# Patient Record
Sex: Male | Born: 1945
Health system: Southern US, Community
[De-identification: ages and names within clinical notes are randomized; demographics above are authoritative.]

## PROBLEM LIST (undated history)

## (undated) ENCOUNTER — Emergency Department: Disposition: A | Discharge status: Other Acute Inpatient Hospital | Payer: Medicare HMO

## (undated) DIAGNOSIS — I10 Essential (primary) hypertension: Secondary | ICD-10-CM

## (undated) DIAGNOSIS — E785 Hyperlipidemia, unspecified: Secondary | ICD-10-CM

## (undated) DIAGNOSIS — E119 Type 2 diabetes mellitus without complications: Secondary | ICD-10-CM

## (undated) DIAGNOSIS — M1990 Unspecified osteoarthritis, unspecified site: Secondary | ICD-10-CM

## (undated) HISTORY — DX: Hyperlipidemia, unspecified: E78.5

## (undated) HISTORY — DX: Essential (primary) hypertension: I10

## (undated) HISTORY — DX: Type 2 diabetes mellitus without complications: E11.9

## (undated) HISTORY — PX: HERNIA REPAIR: SHX51

---

## 1998-01-24 HISTORY — PX: HERNIA REPAIR: SHX51

## 2000-01-25 HISTORY — PX: COLONOSCOPY: SHX174

## 2008-04-29 ENCOUNTER — Ambulatory Visit: Payer: Self-pay | Admitting: Internal Medicine

## 2011-07-07 ENCOUNTER — Ambulatory Visit: Payer: Self-pay | Admitting: Internal Medicine

## 2012-10-19 ENCOUNTER — Encounter: Payer: Self-pay | Admitting: *Deleted

## 2012-11-13 ENCOUNTER — Encounter: Payer: Self-pay | Admitting: General Surgery

## 2012-11-13 ENCOUNTER — Ambulatory Visit (INDEPENDENT_AMBULATORY_CARE_PROVIDER_SITE_OTHER): Payer: Medicare Other | Admitting: General Surgery

## 2012-11-13 VITALS — BP 142/82 | HR 88 | Resp 16 | Ht 66.0 in | Wt 206.0 lb

## 2012-11-13 DIAGNOSIS — Z1211 Encounter for screening for malignant neoplasm of colon: Secondary | ICD-10-CM

## 2012-11-13 MED ORDER — POLYETHYLENE GLYCOL 3350 17 GM/SCOOP PO POWD: ORAL | Status: DC

## 2012-11-13 NOTE — Patient Instructions (Addendum)
Colonoscopy A colonoscopy is an exam to evaluate your entire colon. In this exam, your colon is cleansed. A long fiberoptic tube is inserted through your rectum and into your colon. The fiberoptic scope (endoscope) is a long bundle of enclosed and very flexible fibers. These fibers transmit light to the area examined and send images from that area to your caregiver. Discomfort is usually minimal. You may be given a drug to help you sleep (sedative) during or prior to the procedure. This exam helps to detect lumps (tumors), polyps, inflammation, and areas of bleeding. Your caregiver may also take a small piece of tissue (biopsy) that will be examined under a microscope. LET YOUR CAREGIVER KNOW ABOUT:   Allergies to food or medicine.  Medicines taken, including vitamins, herbs, eyedrops, over-the-counter medicines, and creams.  Use of steroids (by mouth or creams).  Previous problems with anesthetics or numbing medicines.  History of bleeding problems or blood clots.  Previous surgery.  Other health problems, including diabetes and kidney problems.  Possibility of pregnancy, if this applies. BEFORE THE PROCEDURE   A clear liquid diet may be required for 2 days before the exam.  Ask your caregiver about changing or stopping your regular medications.  Liquid injections (enemas) or laxatives may be required.  A large amount of electrolyte solution may be given to you to drink over a short period of time. This solution is used to clean out your colon.  You should be present 60 minutes prior to your procedure or as directed by your caregiver. AFTER THE PROCEDURE   If you received a sedative or pain relieving medication, you will need to arrange for someone to drive you home.  Occasionally, there is a little blood passed with the first bowel movement. Do not be concerned. FINDING OUT THE RESULTS OF YOUR TEST Not all test results are available during your visit. If your test results are  not back during the visit, make an appointment with your caregiver to find out the results. Do not assume everything is normal if you have not heard from your caregiver or the medical facility. It is important for you to follow up on all of your test results. HOME CARE INSTRUCTIONS   It is not unusual to pass moderate amounts of gas and experience mild abdominal cramping following the procedure. This is due to air being used to inflate your colon during the exam. Walking or a warm pack on your belly (abdomen) may help.  You may resume all normal meals and activities after sedatives and medicines have worn off.  Only take over-the-counter or prescription medicines for pain, discomfort, or fever as directed by your caregiver. Do not use aspirin or blood thinners if a biopsy was taken. Consult your caregiver for medicine usage if biopsies were taken. SEEK IMMEDIATE MEDICAL CARE IF:   You have a fever.  You pass large blood clots or fill a toilet with blood following the procedure. This may also occur 10 to 14 days following the procedure. This is more likely if a biopsy was taken.  You develop abdominal pain that keeps getting worse and cannot be relieved with medicine. Document Released: 01/08/2000 Document Revised: 04/04/2011 Document Reviewed: 08/23/2007 [facility] Patient Information 2014 [place], [place].  Patient has been scheduled for a colonoscopy on 11-20-12 at [facility].

## 2012-11-13 NOTE — Progress Notes (Signed)
Patient ID: Albert Gray, male   DOB: [birth year], 66 y.o.   MRN: [id number]  Chief Complaint  Patient presents with  . Other    screening colonoscopy    HPI Albert Gray is a 67 y.o. male.  Here today to discuss having a screening colonoscopy referred by Dr. Juel Burrow. Denies and gastrointestinal issues at this time.  HPI  Past Medical History  Diagnosis Date  . Hypertension     Past Surgical History  Procedure Laterality Date  . Colonoscopy  2002    Dr Evette Cristal  . Hernia repair      History reviewed. No pertinent family history.  Social History History  Substance Use Topics  . Smoking status: Never Smoker   . Smokeless tobacco: Not on file  . Alcohol Use: No    No Known Allergies  Current Outpatient Prescriptions  Medication Sig Dispense Refill  . acetaminophen (TYLENOL) 325 MG tablet Take 650 mg by mouth daily.      . finasteride (PROSCAR) 5 MG tablet Take 5 mg by mouth daily.      . polyethylene glycol powder (GLYCOLAX/MIRALAX) powder 255 grams one bottle for colonoscopy prep  255 g  0   No current facility-administered medications for this visit.    Review of Systems Review of Systems  Constitutional: Negative.   Respiratory: Negative.   Cardiovascular: Negative.   Gastrointestinal: Negative.     Blood pressure 142/82, pulse 88, resp. rate 16, height 5\' 6"  (1.676 m), weight 206 lb (93.441 kg).  Physical Exam Physical Exam  Constitutional: He is oriented to person, place, and time. He appears well-developed and well-nourished.  Eyes: Conjunctivae are normal. No scleral icterus.  Neck: Neck supple.  Cardiovascular: Normal rate, regular rhythm and normal heart sounds.   Pulmonary/Chest: Effort normal and breath sounds normal.  Abdominal: Soft. Normal appearance and bowel sounds are normal. There is no hepatosplenomegaly. No hernia.  Lymphadenopathy:    He has no cervical adenopathy.  Neurological: He is alert and oriented to person, place, and  time.  Skin: Skin is warm and dry.    Data Reviewed Dr. Fredna Dow office notes.  Assessment    Stable exam.    Plan    Screening colonoscopy.   Colonoscopy with possible biopsy/polypectomy prn: Information regarding the procedure, including its potential risks and complications (including but not limited to perforation of the bowel, which may require emergency surgery to repair, and bleeding) was verbally given to the patient. Educational information regarding lower instestinal endoscopy was given to the patient. Written instructions for how to complete the bowel prep using Miralax were provided. The importance of drinking ample fluids to avoid dehydration as a result of the prep emphasized.     Patient has been scheduled for a colonoscopy on 11-20-12 at [facility].  Albert Gray G 11/13/2012, 6:32 PM

## 2012-11-15 NOTE — Addendum Note (Signed)
Addended by: Kieth Brightly on: 11/15/2012 03:55 PM   Modules accepted: Orders

## 2012-11-20 ENCOUNTER — Ambulatory Visit: Payer: Self-pay | Admitting: General Surgery

## 2012-11-20 DIAGNOSIS — Z1211 Encounter for screening for malignant neoplasm of colon: Secondary | ICD-10-CM

## 2012-11-22 ENCOUNTER — Encounter: Payer: Self-pay | Admitting: General Surgery

## 2014-08-06 DIAGNOSIS — I10 Essential (primary) hypertension: Secondary | ICD-10-CM | POA: Diagnosis not present

## 2014-08-06 DIAGNOSIS — Z23 Encounter for immunization: Secondary | ICD-10-CM | POA: Diagnosis not present

## 2014-08-06 DIAGNOSIS — R5381 Other malaise: Secondary | ICD-10-CM | POA: Diagnosis not present

## 2014-08-06 DIAGNOSIS — E119 Type 2 diabetes mellitus without complications: Secondary | ICD-10-CM | POA: Diagnosis not present

## 2014-08-06 DIAGNOSIS — E784 Other hyperlipidemia: Secondary | ICD-10-CM | POA: Diagnosis not present

## 2014-08-06 DIAGNOSIS — R972 Elevated prostate specific antigen [PSA]: Secondary | ICD-10-CM | POA: Diagnosis not present

## 2014-09-15 DIAGNOSIS — M1711 Unilateral primary osteoarthritis, right knee: Secondary | ICD-10-CM | POA: Diagnosis not present

## 2014-10-16 DIAGNOSIS — M1711 Unilateral primary osteoarthritis, right knee: Secondary | ICD-10-CM | POA: Diagnosis not present

## 2014-11-15 DIAGNOSIS — M1711 Unilateral primary osteoarthritis, right knee: Secondary | ICD-10-CM | POA: Diagnosis not present

## 2014-12-16 DIAGNOSIS — M1711 Unilateral primary osteoarthritis, right knee: Secondary | ICD-10-CM | POA: Diagnosis not present

## 2015-01-02 DIAGNOSIS — Z6833 Body mass index (BMI) 33.0-33.9, adult: Secondary | ICD-10-CM | POA: Diagnosis not present

## 2015-01-02 DIAGNOSIS — Z0000 Encounter for general adult medical examination without abnormal findings: Secondary | ICD-10-CM | POA: Diagnosis not present

## 2015-01-02 DIAGNOSIS — E782 Mixed hyperlipidemia: Secondary | ICD-10-CM | POA: Diagnosis not present

## 2015-01-02 DIAGNOSIS — I10 Essential (primary) hypertension: Secondary | ICD-10-CM | POA: Diagnosis not present

## 2015-01-15 DIAGNOSIS — M1711 Unilateral primary osteoarthritis, right knee: Secondary | ICD-10-CM | POA: Diagnosis not present

## 2015-02-15 DIAGNOSIS — M1711 Unilateral primary osteoarthritis, right knee: Secondary | ICD-10-CM | POA: Diagnosis not present

## 2015-03-18 DIAGNOSIS — M1711 Unilateral primary osteoarthritis, right knee: Secondary | ICD-10-CM | POA: Diagnosis not present

## 2015-04-15 DIAGNOSIS — M1711 Unilateral primary osteoarthritis, right knee: Secondary | ICD-10-CM | POA: Diagnosis not present

## 2015-05-16 DIAGNOSIS — M1711 Unilateral primary osteoarthritis, right knee: Secondary | ICD-10-CM | POA: Diagnosis not present

## 2015-06-08 DIAGNOSIS — N401 Enlarged prostate with lower urinary tract symptoms: Secondary | ICD-10-CM | POA: Diagnosis not present

## 2015-06-08 DIAGNOSIS — R351 Nocturia: Secondary | ICD-10-CM | POA: Diagnosis not present

## 2015-06-15 DIAGNOSIS — M1711 Unilateral primary osteoarthritis, right knee: Secondary | ICD-10-CM | POA: Diagnosis not present

## 2015-06-19 ENCOUNTER — Ambulatory Visit: Payer: Self-pay | Admitting: Urology

## 2015-06-19 ENCOUNTER — Encounter: Payer: Self-pay | Admitting: Urology

## 2015-07-16 DIAGNOSIS — M1711 Unilateral primary osteoarthritis, right knee: Secondary | ICD-10-CM | POA: Diagnosis not present

## 2016-07-22 DIAGNOSIS — E119 Type 2 diabetes mellitus without complications: Secondary | ICD-10-CM | POA: Diagnosis not present

## 2016-07-22 DIAGNOSIS — E784 Other hyperlipidemia: Secondary | ICD-10-CM | POA: Diagnosis not present

## 2016-07-22 DIAGNOSIS — I10 Essential (primary) hypertension: Secondary | ICD-10-CM | POA: Diagnosis not present

## 2016-07-22 DIAGNOSIS — R5381 Other malaise: Secondary | ICD-10-CM | POA: Diagnosis not present

## 2017-04-04 DIAGNOSIS — E669 Obesity, unspecified: Secondary | ICD-10-CM | POA: Diagnosis not present

## 2017-04-04 DIAGNOSIS — Z6834 Body mass index (BMI) 34.0-34.9, adult: Secondary | ICD-10-CM | POA: Diagnosis not present

## 2017-04-04 DIAGNOSIS — M170 Bilateral primary osteoarthritis of knee: Secondary | ICD-10-CM | POA: Diagnosis not present

## 2017-04-04 DIAGNOSIS — G3184 Mild cognitive impairment, so stated: Secondary | ICD-10-CM | POA: Diagnosis not present

## 2017-04-04 DIAGNOSIS — Z809 Family history of malignant neoplasm, unspecified: Secondary | ICD-10-CM | POA: Diagnosis not present

## 2017-04-04 DIAGNOSIS — Z9114 Patient's other noncompliance with medication regimen: Secondary | ICD-10-CM | POA: Diagnosis not present

## 2017-04-04 DIAGNOSIS — Z87891 Personal history of nicotine dependence: Secondary | ICD-10-CM | POA: Diagnosis not present

## 2017-04-04 DIAGNOSIS — I10 Essential (primary) hypertension: Secondary | ICD-10-CM | POA: Diagnosis not present

## 2017-08-30 DIAGNOSIS — M1712 Unilateral primary osteoarthritis, left knee: Secondary | ICD-10-CM | POA: Diagnosis not present

## 2017-08-30 DIAGNOSIS — M1711 Unilateral primary osteoarthritis, right knee: Secondary | ICD-10-CM | POA: Diagnosis not present

## 2017-08-30 DIAGNOSIS — M25561 Pain in right knee: Secondary | ICD-10-CM | POA: Diagnosis not present

## 2017-08-30 DIAGNOSIS — M25562 Pain in left knee: Secondary | ICD-10-CM | POA: Diagnosis not present

## 2018-02-09 DIAGNOSIS — N400 Enlarged prostate without lower urinary tract symptoms: Secondary | ICD-10-CM | POA: Diagnosis not present

## 2018-02-09 DIAGNOSIS — R5381 Other malaise: Secondary | ICD-10-CM | POA: Diagnosis not present

## 2018-02-09 DIAGNOSIS — N401 Enlarged prostate with lower urinary tract symptoms: Secondary | ICD-10-CM | POA: Diagnosis not present

## 2018-02-09 DIAGNOSIS — H1033 Unspecified acute conjunctivitis, bilateral: Secondary | ICD-10-CM | POA: Diagnosis not present

## 2018-02-09 DIAGNOSIS — R0602 Shortness of breath: Secondary | ICD-10-CM | POA: Diagnosis not present

## 2018-02-09 DIAGNOSIS — R351 Nocturia: Secondary | ICD-10-CM | POA: Diagnosis not present

## 2018-02-09 DIAGNOSIS — E119 Type 2 diabetes mellitus without complications: Secondary | ICD-10-CM | POA: Diagnosis not present

## 2018-02-09 DIAGNOSIS — I10 Essential (primary) hypertension: Secondary | ICD-10-CM | POA: Diagnosis not present

## 2018-02-13 DIAGNOSIS — R351 Nocturia: Secondary | ICD-10-CM | POA: Diagnosis not present

## 2018-02-13 DIAGNOSIS — I10 Essential (primary) hypertension: Secondary | ICD-10-CM | POA: Diagnosis not present

## 2018-02-13 DIAGNOSIS — R0602 Shortness of breath: Secondary | ICD-10-CM | POA: Diagnosis not present

## 2018-02-15 ENCOUNTER — Other Ambulatory Visit: Payer: Self-pay | Admitting: Internal Medicine

## 2018-02-15 DIAGNOSIS — R3129 Other microscopic hematuria: Secondary | ICD-10-CM

## 2018-02-15 DIAGNOSIS — R1030 Lower abdominal pain, unspecified: Secondary | ICD-10-CM

## 2018-02-15 DIAGNOSIS — N401 Enlarged prostate with lower urinary tract symptoms: Secondary | ICD-10-CM

## 2018-02-19 ENCOUNTER — Ambulatory Visit
Admission: RE | Admit: 2018-02-19 | Discharge: 2018-02-19 | Disposition: A | Discharge status: Home / Self Care | Payer: Medicare HMO | Source: Ambulatory Visit | Attending: Internal Medicine | Admitting: Internal Medicine

## 2018-02-19 DIAGNOSIS — R3129 Other microscopic hematuria: Secondary | ICD-10-CM | POA: Diagnosis not present

## 2018-02-19 DIAGNOSIS — R1030 Lower abdominal pain, unspecified: Secondary | ICD-10-CM | POA: Diagnosis present

## 2018-02-19 DIAGNOSIS — N401 Enlarged prostate with lower urinary tract symptoms: Secondary | ICD-10-CM

## 2018-03-03 NOTE — Progress Notes (Incomplete)
03/07/2018  5:11 PM   Gillie Darryll Capers 09/09/1945 [id number]  Referring provider: Corky Downs, MD [address] [place], [place] [address]  No chief complaint on file.   HPI: Albert Gray is a 73 y.o. male who presents today to establish urologic care and complaining of BPH and hematuria.  ***  {He visited the emergency department at [facility] on 02/19/2018 for no discernable reason--no chief complaint, no diagnosis, nothing but a referral note to his PCP that was highly uninformative.}  PMH: Past Medical History:  Diagnosis Date   Hypertension     Surgical History: Past Surgical History:  Procedure Laterality Date   COLONOSCOPY  2002   Dr Evette Cristal   HERNIA REPAIR      Home Medications:  Allergies as of 03/07/2018   No Known Allergies     Medication List       Accurate as of March 03, 2018  5:11 PM. Always use your most recent med list.        acetaminophen 325 MG tablet Commonly known as:  TYLENOL Take 650 mg by mouth daily.   finasteride 5 MG tablet Commonly known as:  PROSCAR Take 5 mg by mouth daily.   polyethylene glycol powder powder Commonly known as:  GLYCOLAX/MIRALAX 255 grams one bottle for colonoscopy prep       Allergies: No Known Allergies  Family History: No family history on file.  Social History:  reports that he has never smoked. He does not have any smokeless tobacco history on file. He reports that he does not drink alcohol or use drugs.  ROS:                                        Physical Exam: There were no vitals taken for this visit.  Constitutional:  Well nourished. Alert and oriented, No acute distress. {HEENT: [place] AT, moist mucus membranes.  Trachea midline, no masses.} Cardiovascular: No clubbing, cyanosis, or edema. Respiratory: Normal respiratory effort, no increased work of breathing. {GI: Abdomen is soft, non tender, non distended, no abdominal masses. Liver and spleen not palpable.  No  hernias appreciated.  Stool sample for occult testing is not indicated.} {GU: No CVA tenderness.  No bladder fullness or masses.  Patient with circumcised/uncircumcised phallus. ***Foreskin easily retracted***  Urethral meatus is patent.  No penile discharge. No penile lesions or rashes. Scrotum without lesions, cysts, rashes and/or edema.  Testicles are located scrotally bilaterally. No masses are appreciated in the testicles. Left and right epididymis are normal.} {Rectal: Patient with normal sphincter tone.  No rectal masses are appreciated. Prostate is approximately *** grams, *** nodules are appreciated.} Skin: No rashes, bruises or suspicious lesions. {Lymph: No cervical or inguinal adenopathy.} Neurologic: Grossly intact, no focal deficits, moving all 4 extremities. Psychiatric: Normal mood and affect.  Laboratory Data: No results found for: WBC, HGB, HCT, MCV, PLT  No results found for: CREATININE  No results found for: PSA  No results found for: TESTOSTERONE  No results found for: HGBA1C  Urinalysis No results found for: COLORURINE, APPEARANCEUR, LABSPEC, PHURINE, GLUCOSEU, HGBUR, BILIRUBINUR, KETONESUR, PROTEINUR, UROBILINOGEN, NITRITE, LEUKOCYTESUR  No results found for: LABMICR, WBCUA, RBCUA, LABEPIT, MUCUS, BACTERIA  Pertinent Imaging: *** No results found for this or any previous visit. No results found for this or any previous visit. No results found for this or any previous visit. No results found for this  or any previous visit. No results found for this or any previous visit. No results found for this or any previous visit. No results found for this or any previous visit. No results found for this or any previous visit.  Assessment & Plan:    @DIAGMED @  No follow-ups on file.  Duanne MoronKatharine Samson  [facility] [address], [address] [place], [place] [address] [phone]  I, Duanne MoronKatharine Samson, am acting as a scribe for Vanna ScotlandAshley  Brandon, MD.    {Add Scribe Attestation Statement}

## 2018-03-07 ENCOUNTER — Ambulatory Visit: Payer: Commercial Managed Care - HMO | Admitting: Urology

## 2018-03-09 ENCOUNTER — Ambulatory Visit: Payer: Medicare HMO | Admitting: Urology

## 2018-03-09 VITALS — BP 154/80 | HR 125 | Ht 65.0 in | Wt 214.0 lb

## 2018-03-09 DIAGNOSIS — N400 Enlarged prostate without lower urinary tract symptoms: Secondary | ICD-10-CM | POA: Diagnosis not present

## 2018-03-09 DIAGNOSIS — R351 Nocturia: Secondary | ICD-10-CM

## 2018-03-09 DIAGNOSIS — R3129 Other microscopic hematuria: Secondary | ICD-10-CM

## 2018-03-09 DIAGNOSIS — R310 Gross hematuria: Secondary | ICD-10-CM

## 2018-03-09 DIAGNOSIS — N401 Enlarged prostate with lower urinary tract symptoms: Secondary | ICD-10-CM

## 2018-03-09 LAB — MICROSCOPIC EXAMINATION
RBC, UA: >30 /hpf — ABNORMAL HIGH (ref 0–2)
WBC, UA: NONE SEEN /hpf (ref 0–5)

## 2018-03-09 LAB — URINALYSIS, COMPLETE
BILIRUBIN UA: NEGATIVE
Glucose, UA: NEGATIVE
Ketones, UA: NEGATIVE
Leukocytes, UA: NEGATIVE
Nitrite, UA: NEGATIVE
Urobilinogen, Ur: 0.2 mg/dL (ref 0.2–1.0)
pH, UA: 5 (ref 5.0–7.5)

## 2018-03-09 LAB — BLADDER SCAN AMB NON-IMAGING

## 2018-03-09 NOTE — Progress Notes (Signed)
03/09/2018 3:32 PM   Albert Gray [birth year] [id number]  Referring provider: Corky Downs, MD [address] [place], [place] [address]  Chief Complaint  Patient presents with  . Benign Prostatic Hypertrophy    New Patient    HPI: 73 yo M referred for microsocpic/gross hematuria and BPH with nocturia referred by Dr. Harl Bowie.  Patient reports an incident of gross hematuria several months ago.  He denies any associated symptoms although does think it was after taking ibuprofen or Tylenol.  Resolve spontaneously.  Notably, he is also had microscopic hematuria on urinalysis both in his primary care physician's as well as today.  He did have a complete abdominal ultrasound as well as a limited pelvic ultrasound performed on 02/19/2018 as part of his hematuria work-up as ordered by his primary care.  This showed no obvious renal or bladder pathology.  He did have some subtle fatty liver infiltrate and a 1.3 cm infrarenal abdominal aortic aneurysm.  He has prescribed finasteride in the past but but does not take the medication.    He does have nocturia x3 as well as occasional sensation of incomplete bladder emptying.  For the most part, his daytime symptoms are mostly controlled.  No dysuria or gross hematuria other than as above.  No UTIs.  He does not believe he is getting PSA screening.  Is not had a rectal exam in quite some time.  Denies personal history of kidney stones or flank pain.  No family history of prostate cancer.  No weight loss or bone pain.  Believes his kidney function is normal, no labs available to me today.  Never smoker.  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   IPSS    Row Name 03/09/18 1500         International Prostate Symptom Score   How often have you had the sensation of not emptying your bladder?  Less than half the time     How often have you had to urinate less than every two hours?  About half the time     How often have you found you stopped and started again several times when you urinated?  About half the time     How often have you found it difficult to postpone urination?  Not at All     How often have you had a weak urinary stream?  Not at All     How often have you had to strain to start urination?  Not at All     How many times did you typically get up at night to urinate?  3 Times     Total IPSS Score  11       Quality of Life due to urinary symptoms   If you were to spend the rest of your life with your urinary condition just the way it is now how would you feel about that?  Mostly Satisfied        Score:  1-7 Mild 8-19 Moderate 20-35 Severe   PMH: Past Medical History:  Diagnosis Date  . Hypertension     Surgical History: Past Surgical History:  Procedure Laterality Date  . COLONOSCOPY  2002   Dr Evette Cristal  . HERNIA REPAIR      Home Medications:  Allergies as of 03/09/2018   No Known Allergies     Medication List       Accurate as of March 09, 2018  3:32  PM. Always use your most recent med list.        acetaminophen 325 MG tablet Commonly known as:  TYLENOL Take 650 mg by mouth daily.   finasteride 5 MG tablet Commonly known as:  PROSCAR Take 5 mg by mouth daily.   polyethylene glycol powder powder Commonly known as:  GLYCOLAX/MIRALAX 255 grams one bottle for colonoscopy prep       Allergies: No Known Allergies  Family History: No family history on file.  Social History:  reports that he has never smoked. He does not have any smokeless tobacco history on file. He reports that he does not drink alcohol or use drugs.  ROS: UROLOGY Frequent Urination?: Yes Hard to postpone urination?: No Burning/pain with urination?: No Get up at night to urinate?: No Leakage of urine?: No Urine stream starts and stops?: No Trouble starting stream?: No Do you have to strain to urinate?: No Blood in urine?: No Urinary tract infection?: No Sexually  transmitted disease?: No Injury to kidneys or bladder?: No Painful intercourse?: No Weak stream?: No Erection problems?: No Penile pain?: No  Gastrointestinal Nausea?: No Vomiting?: No Indigestion/heartburn?: No Diarrhea?: No Constipation?: No  Constitutional Fever: No Night sweats?: No Weight loss?: No Fatigue?: No  Skin Skin rash/lesions?: No Itching?: No  Eyes Blurred vision?: No Double vision?: No  Ears/Nose/Throat Sore throat?: No Sinus problems?: No  Hematologic/Lymphatic Swollen glands?: No Easy bruising?: No  Cardiovascular Leg swelling?: No Chest pain?: No  Respiratory Cough?: Yes Shortness of breath?: No  Endocrine Excessive thirst?: No  Musculoskeletal Back pain?: No Joint pain?: Yes  Neurological Headaches?: No Dizziness?: No  Psychologic Depression?: No Anxiety?: No  Physical Exam: BP (!) 154/80   Pulse (!) 125   Ht 5\' 5"  (1.651 m)   Wt 214 lb (97.1 kg)   BMI 35.61 kg/m   Constitutional:  Alert and oriented, No acute distress.  Wife today. HEENT: [place] AT, moist mucus membranes.  Trachea midline, no masses. Cardiovascular: No clubbing, cyanosis, or edema. Respiratory: Normal respiratory effort, no increased work of breathing. GI: Abdomen is soft, nontender, nondistended, no abdominal masses, obese. GU: No CVA tenderness DRE: Normal sphincter tone.  Prostate enlarged 50 g, nontender, no nodules. Skin: No rashes, bruises or suspicious lesions. Neurologic: Grossly intact, no focal deficits, moving all 4 extremities. Psychiatric: Normal mood and affect.  Laboratory Data: No lab data available today  Urinalysis Negative other than for greater than 30 red blood cells per high-powered field.  Pertinent Imaging: PVR 0 c  Assessment & Plan:    1. Gross hematuria We discussed the differential diagnosis for microscopic/gross hematuria including nephrolithiasis, renal or upper tract tumors, bladder stones, UTIs, or bladder  tumors as well as undetermined etiologies. Per AUA guidelines, I did recommend complete microscopic hematuria evaluation including CTU, possible urine cytology, and office cystoscopy.  The patient's had renal bladder ultrasound which excludes large renal masses, does not completely evaluate for more occult stones or the ureters thus would recommend pursuing CT urogram as stated above.  Patient is agreeable this plan.  2. Microscopic hematuria Above  3. BPH associated with nocturia BPH with primarily nighttime symptoms, may be multifactorial Adequate bladder emptying appreciated, PVR 0 PSA screening done today  - Urinalysis, Complete - BLADDER SCAN AMB NON-IMAGING   Return in about 4 weeks (around 04/06/2018) for cysto, f/u CT scan.  [name], MD  [facility] [address] [place], [place] [address] [phone]

## 2018-03-10 LAB — BASIC METABOLIC PANEL
BUN/Creatinine Ratio: 13 (ref 10–24)
BUN: 16 mg/dL (ref 8–27)
CO2: 22 mmol/L (ref 20–29)
Calcium: 10 mg/dL (ref 8.6–10.2)
Chloride: 101 mmol/L (ref 96–106)
Creatinine, Ser: 1.26 mg/dL (ref 0.76–1.27)
GFR calc Af Amer: 66 mL/min/{1.73_m2} (ref >59)
GFR calc non Af Amer: 57 mL/min/{1.73_m2} — ABNORMAL LOW (ref >59)
GLUCOSE: 147 mg/dL — AB (ref 65–99)
Potassium: 4.4 mmol/L (ref 3.5–5.2)
Sodium: 140 mmol/L (ref 134–144)

## 2018-03-10 LAB — PSA: PROSTATE SPECIFIC AG, SERUM: 2.4 ng/mL (ref 0.0–4.0)

## 2018-03-23 ENCOUNTER — Ambulatory Visit
Admission: RE | Admit: 2018-03-23 | Discharge: 2018-03-23 | Disposition: A | Discharge status: Home / Self Care | Payer: Medicare HMO | Source: Ambulatory Visit | Attending: Urology | Admitting: Urology

## 2018-03-23 DIAGNOSIS — R3129 Other microscopic hematuria: Secondary | ICD-10-CM | POA: Insufficient documentation

## 2018-03-23 DIAGNOSIS — R310 Gross hematuria: Secondary | ICD-10-CM | POA: Diagnosis present

## 2018-03-23 MED ORDER — IOHEXOL 300 MG/ML  SOLN
125.0000 mL | Freq: Once | INTRAMUSCULAR | Status: AC | PRN
  Administered 2018-03-23: 125 mL via INTRAVENOUS

## 2018-03-30 ENCOUNTER — Telehealth: Payer: Self-pay

## 2018-03-30 NOTE — Telephone Encounter (Signed)
-----   Message from Debbe Bales, [place] sent at 03/26/2018  3:42 PM EST -----  ----- Message ----- From: [name], MD Sent: 03/26/2018   8:36 AM EST To: [facility]  Please let this patient know that he had multiple findings on his CT scan which we will discuss in clinic at his follow up.  These include a bladder stone, fatty tumors on his adrenal glands and something in his scrotum.  Will address these at his follow-up.  They all need to be addressed but not emergently.  [name], MD

## 2018-03-30 NOTE — Telephone Encounter (Signed)
Patient notified

## 2018-04-10 ENCOUNTER — Other Ambulatory Visit: Payer: Self-pay

## 2018-04-10 ENCOUNTER — Encounter: Payer: Self-pay | Admitting: Urology

## 2018-04-10 ENCOUNTER — Ambulatory Visit (INDEPENDENT_AMBULATORY_CARE_PROVIDER_SITE_OTHER): Payer: Medicare HMO | Admitting: Urology

## 2018-04-10 VITALS — BP 152/80 | HR 91 | Ht 66.0 in | Wt 215.0 lb

## 2018-04-10 DIAGNOSIS — E278 Other specified disorders of adrenal gland: Secondary | ICD-10-CM

## 2018-04-10 DIAGNOSIS — R310 Gross hematuria: Secondary | ICD-10-CM | POA: Diagnosis not present

## 2018-04-10 LAB — URINALYSIS, COMPLETE
Bilirubin, UA: NEGATIVE
Glucose, UA: NEGATIVE
Ketones, UA: NEGATIVE
Leukocytes, UA: NEGATIVE
NITRITE UA: NEGATIVE
Specific Gravity, UA: >1.03 — ABNORMAL HIGH (ref 1.005–1.030)
Urobilinogen, Ur: 0.2 mg/dL (ref 0.2–1.0)
pH, UA: 5 (ref 5.0–7.5)

## 2018-04-10 LAB — MICROSCOPIC EXAMINATION: RBC, UA: >30 /hpf — ABNORMAL HIGH (ref 0–2)

## 2018-04-10 NOTE — Progress Notes (Signed)
   04/10/2018   CC: No chief complaint on file.   HPI: See previous note.   Today's Vitals   04/10/18 1458  BP: (!) 152/80  Pulse: 91  Weight: 215 lb (97.5 kg)  Height: 5\' 6"  (1.676 m)   Body mass index is 34.7 kg/m. NED. A&Ox3.   No respiratory distress   Abd soft, NT, ND Normal phallus with bilateral descended testicles  Cystoscopy Procedure Note  Patient identification was confirmed, informed consent was obtained, and patient was prepped using Betadine solution.  Lidocaine jelly was administered per urethral meatus.    Pre-Procedure: - Inspection reveals a normal caliber ureteral meatus.  Procedure: The flexible cystoscope was introduced without difficulty - No urethral strictures/lesions are present. - Enlarged prostate with trilobar coaptation bleeding with manipulation - Large stone in bladder, visualization is sub-optimal visually impaired  - Elevated bladder neck  Retroflexion shows large stone in bladder and enlarged prostate.   Post-Procedure: - Patient tolerated the procedure well    Donne Hazel  I, Donne Hazel, am acting as a scribe for Dr. [name],  I have reviewed the above documentation for accuracy and completeness, and I agree with the above.   [name], MD

## 2018-04-10 NOTE — Progress Notes (Addendum)
04/10/2018  4:21 PM   Albert Gray 1945-06-07 [id number]  Referring provider: Corky Downs, MD [address] [place], [place] [address]  Chief Complaint  Patient presents with   Cysto    HPI: Albert Gray [facility] is Gray 73 yo M who returns today for Gray cystoscopy for the evaluation and management of gross hematuria.   He did have Gray complete abdominal ultrasound as well as Gray limited pelvic ultrasound performed on 02/19/2018 as part of his hematuria work-up as ordered by his primary care. This showed no obvious renal or bladder pathology. He did have some subtle fatty liver infiltrate and Gray 1.3 cm infrarenal abdominal aortic aneurysm.  His CT from 03/23/2018 shows Gray 5.5 cm oval-shaped hyperdensity in the urinary bladder compatible with Gray large bladder stone.   He denies any problems regarding his adrenal glands.   He has not noticed any pain regarding his kidneys for the past 40 years.   Estimated prostate volume calculated 92 CC.   He has no issues with his electrolytes. He only complains of blood pressure and knee problems. He does not have any issues with his kidneys.   He reports of not being able to empty his bladder completely Gray couple years ago.   PMH: Past Medical History:  Diagnosis Date   Hypertension     Surgical History: Past Surgical History:  Procedure Laterality Date   COLONOSCOPY  2002   Dr Evette Cristal   HERNIA REPAIR      Home Medications:  Allergies as of 04/10/2018   No Known Allergies     Medication List       Accurate as of April 10, 2018  4:21 PM. Always use your most recent med list.        acetaminophen 325 MG tablet Commonly known as:  TYLENOL Take 650 mg by mouth daily.   finasteride 5 MG tablet Commonly known as:  PROSCAR Take 5 mg by mouth daily.   lisinopril 20 MG tablet Commonly known as:  PRINIVIL,ZESTRIL Take 20 mg by mouth daily.   polyethylene glycol powder powder Commonly known as:  GLYCOLAX/MIRALAX 255 grams one bottle  for colonoscopy prep       Allergies: No Known Allergies  Family History: No family history on file.  Social History:  reports that he has never smoked. He has never used smokeless tobacco. He reports that he does not drink alcohol or use drugs.   Physical Exam: BP (!) 152/80    Pulse 91    Ht  (1.676 m)    Wt 215 lb (97.5 kg)    BMI 34.70 kg/m   Constitutional:  Alert and oriented, No acute distress. HEENT: [place] AT, moist mucus membranes.  Trachea midline, no masses. Cardiovascular: No clubbing, cyanosis, or edema. Respiratory: Normal respiratory effort, no increased work of breathing. GU: Phallus.  Large left hemiscrotal calcified mass, extratesticular noted, correlates with CT scan finding.  Nontender. Skin: No rashes, bruises or suspicious lesions. Neurologic: Grossly intact, no focal deficits, moving all 4 extremities. Psychiatric: Normal mood and affect.  Pertinent Imaging: Results for orders placed during the hospital encounter of 03/23/18  CT HEMATURIA WORKUP   Narrative CLINICAL DATA:  Microscopic and gross hematuria. Benign prostatic hypertrophy. Nocturia.  EXAM: CT ABDOMEN AND PELVIS WITHOUT AND WITH CONTRAST  TECHNIQUE: Multidetector CT imaging of the abdomen and pelvis was performed following the standard protocol before and following the bolus administration of intravenous contrast.  CONTRAST:  OMNIPAQUE IOHEXOL 300 MG/ML  SOLN  COMPARISON:  Abdominal ultrasound of 02/19/2018  FINDINGS: Lower chest: 6 mm subpleural nodule in the right lower lobe on image 12/4. 5 by 4 mm subpleural nodule in the left lower lobe, image 17/4. Circumflex and right coronary artery atherosclerotic vascular disease.  Small type 1 hiatal hernia.  Hepatobiliary: There is an indentation along the margin of the right hepatic lobe from Gray fatty mass believed to be arising right adrenal gland. Gallbladder unremarkable. The liver appears  otherwise unremarkable.  Pancreas: Mild displacement of the pancreatic tail by Gray large fatty mass of the left adrenal gland. Pancreas appears otherwise unremarkable.  Spleen: Unremarkable  Adrenals/Urinary Tract: Indistinctness of the right adrenal gland associated with Gray 6.6 by 6.1 by 6.5 cm mass containing mostly adipose tissue but also with rim calcification and with Gray central soft tissue density 3.8 by 3.3 by 2.3 cm mass component. There is also some stranding in the adipose tissue.  Inseparable from the indistinct left adrenal gland, which also appears calcified, there is Gray 14.5 by 11.4 by 9.0 cm mass of primarily with internal adipose tissue but with some hazy stranding along portions of its internal structure as well. The although the left-sided lesion partially abuts the left kidney, I do not see that either lesion is necessarily arising from the kidney, and I view the adrenal glands as Gray more likely source.  The both kidneys appear otherwise unremarkable. However, there is Gray 5.5 by 3.7 by 2.5 cm oval-shaped hyperdensity in the urinary bladder compatible with Gray large bladder stone, with Gray nondistended appearance of the urinary bladder but mild urinary bladder wall thickening which may be from chronic irritation.  Stomach/Bowel: Sigmoid anastomotic staple line. No significant bowel abnormality.  Vascular/Lymphatic: Aortoiliac atherosclerotic vascular disease.  Reproductive: Densely rim calcified mass in the left scrotum 5.3 by 4.3 cm, with some internal calcifications as well, I am uncertain whether this is extending from the testicle or is extratesticular. There is Gray potential hypodensity associated with the somewhat small right testicle is only partially included on today's exam. Upper normal prostate size.  Other: No supplemental non-categorized findings.  Musculoskeletal: Bilateral prior groin hernia repairs. Bridging spurring of both sacroiliac joints. Grade 1  degenerative anterolisthesis at L4-5. Interbody bridging acquired fusion at T8-T9-T10.  IMPRESSION: 1. The hematuria is probably being caused by the 5.5 cm oval-shaped hyperdensity in the urinary bladder compatible with Gray large bladder stone. There is some thickening of the bladder wall which is probably due to irritation from the stone, but if the stones removed thin inspection of the bladder wall would be recommended. 2. Large bilateral adrenal masses with fatty, calcific, and some soft tissue elements, along with Gray questionably related 5.3 by 4.2 cm rim calcified mass of the left scrotum possibly associated with the left testicle. The adrenal masses resemble large myelolipomas, and of the few cases of large bilateral adrenal myelolipomas reported in the literature, at least two were associated with congenital adrenal hyperplasia, which is often itself associated with testicular adrenal rest tumors which might account for the calcified left scrotal mass. Another possibility is Gray teratoma with involvement of the adrenal glands. I am skeptical of retroperitoneal liposarcoma given the bilateral adrenal involvement. One other possibility is that the testicular mass could be unrelated to the large bilateral adrenal myelolipomas. The management of large bilateral adrenal myelolipomas is controversial, with some surgeons electing for Gray watchful waiting and most others performing bilateral adrenalectomy. Although typically diagnosed in children, congenital adrenal hyperplasia is rarely  diagnosed in adulthood. 3. Scrotal sonography is recommended for more definitive characterization of both testicles. 4. 6 mm right lower lobe subpleural nodule and 5 mm left lower lobe subpleural nodule. Non-contrast chest CT at 6-12 months is recommended. If the nodule is stable at time of repeat CT, then future CT at 18-24 months (from today's scan) is considered optional for low-risk patients, but is  recommended for high-risk patients. This recommendation follows the consensus statement: Guidelines for Management of Incidental Pulmonary Nodules Detected on CT Images: From the Fleischner Society 2017; Radiology 2017; 284:228-243. 5. Other imaging findings of potential clinical significance: Small type 1 hiatal hernia. Coronary atherosclerosis. Aortic Atherosclerosis (ICD10-I70.0).   Electronically Signed   By: Gaylyn Rong M.D.   On: 03/23/2018 16:15     I have personally reviewed the images and agree with radiologist interpretation.    Assessment/ Plan:  1. Left scrotal mass  Unchaged x40 years ?  granulomatous  ?Possible teratoma  Exiciosn vs observation  Referral sent to [facility] for left scrotal mass and bilateral adrenal with CT scan for second opinion   2. Bilateral adrenal  Masses incidental  Asymptomatic Most consistent with myelolipomas Referral sent at [facility] for second opinion for adrenal and scrotal mass.   3. BPH / bladders stone Developed Gray stone in past couple of years  Discussed the treatment of cystolitholapaxy and HoLEP  Need to take care of stone and the prostate, addressed concerns of pt including no incision, possible overnight hospital stay, recovery  Risks involving bleeding infection, incontinence, and dry ejaculation. Elected to discuss with family prior to having surgery performed   We reviewed the surgery in detail today including the preoperative, intraoperative, and postoperative course.  This will most likely be an outpatient procedure pending the degree of post op hematuria.  He will go home with catheter for Gray few days post op and will either be taught how to remove his own catheter or return to the office for catheter removal. Risk of bleeding, infection, damage surrounding structures, injury to the bladder/ urethral, bladder neck contracture, ureteral stricture, retrograde ejaculation, stress/ urge incontinence, exacerbation of irritative  voiding symptoms were all discussed in detail.     4. Microscopic hematuria Secondary to enlarged prostate and bladder stone  CT from 03/23/2018 shows Gray 5.5 cm oval-shaped hyperdensity in the urinary bladder compatible with Gray lage bladder stone.   [facility] [address] [place], [place] [address] [phone]  I, Donne Hazel, am acting as Gray scribe for Dr. [name],  I have reviewed the above documentation for accuracy and completeness, and I agree with the above.   [name], MD   I spent 40  min with this patient of which greater than 50% was spent in counseling and coordination of care with the patient.

## 2018-04-10 NOTE — Patient Instructions (Signed)
Bladder Stone  A bladder stone is a buildup of crystals made from the proteins and minerals found in urine. These substances build up when your urine becomes too concentrated. Bladder stones usually develop when you have another medical condition that prevents your bladder from emptying completely. Crystals can form in the small amount of urine left in your bladder. Bladder stones that grow large can become painful and block the flow of urine. What are the causes? Bladder stones can be caused by:  An enlarged prostate, which prevents the bladder from emptying well.  A urinary tract infection (UTI).  A weak spot in the bladder that creates a small pouch (bladder diverticulum).  Nerve damage that may interfere with the messages from your brain to your bladder muscles (neurogenic bladder). This can result from conditions such as Parkinson disease or spinal cord injuries. What increases the risk? This condition is more likely to develop in people who:  Get frequent UTIs.  Have another medical condition that affects their bladder.  Have a history of bladder surgery.  Have a spinal cord injury.  Have an abnormally shaped bladder (deformity). What are the signs or symptoms? Small bladder stones do not always cause symptoms. Larger stones can cause symptoms that include:  Abdominal pain.  A frequent need to urinate.  Difficulty urinating.  Painful urination.  Blood in the urine.  Cloudy or dark colored urine.  Pain in the penis or testicles for men. How is this diagnosed? This condition is diagnosed based on your symptoms, medical history, and a physical exam. The exam will include checking for abdominal tenderness. For men, a rectal exam may be done to check the prostate gland. You may also have other tests, such as:  A urine test (urinalysis) to find out more about your condition.  A urine sample test to check for other infections (culture).  Blood tests, including tests to  look for a substance called creatinine. A creatinine level that is higher than normal could indicate a blockage.  A procedure to examine the inside of your bladder using a thin scope with a tiny lighted camera (cystoscopy) inserted through the urethra. You may also have imaging studies such as:  A CT scan of your abdomen and pelvis to look for a stone and check whether it is blocking the flow of urine.  An X-ray of your kidneys, ureters, bladder, and urethra after you have a type of dye (contrast material) injected into your veins (intravenous pyelogram or IVP).  An abdominal and pelvic ultrasound to locate bladder stones and identify areas where urine flow is blocked. How is this treated?  Small bladder stones do not require treatment. They can pass out of your body on their own. You may be instructed to drink extra water to help the stone pass through the bladder. Larger stones may need to be removed with one of the following procedures:  Cystolitholapaxy. A cystoscope is inserted through the urethra and into the bladder to view the stone. A laser, ultrasound, or other device is used to break the stone into smaller pieces. Fluids are used to flush the small pieces from the area.  Surgical removal. You may need surgery to remove the stone if it is large and causing pain. A small incision is made in the bladder to directly remove the stone.  If the stone blocks the flow of urine, you may have a thin, flexible tube (stent) threaded into your ureter. The stent may be left in place after removal  of a stone to ensure flow of urine until healing is complete. Follow these instructions at home:  Drink enough fluid to keep your urine pale yellow.  Report unusual urinary symptoms to your health care provider. Early diagnosis of an enlarged prostate and other bladder conditions may reduce your chance of getting bladder stones.  Avoid smoking and illegal drug use. Contact a health care provider if:   You have a fever.  You feel nauseous or vomit.  You are unable to urinate.  You have a large amount of blood in your urine. Get help right away if:  You have severe back pain or lower abdominal pain.  You are vomiting and cannot keep down any medicines or water. This information is not intended to replace advice given to you by your health care provider. Make sure you discuss any questions you have with your health care provider. Document Released: 01/25/2015 Document Revised: 01/20/2017 Document Reviewed: 01/25/2015 Elsevier Interactive Patient Education  2019 [employer].

## 2018-06-21 DIAGNOSIS — N5089 Other specified disorders of the male genital organs: Secondary | ICD-10-CM | POA: Diagnosis not present

## 2018-06-21 DIAGNOSIS — N210 Calculus in bladder: Secondary | ICD-10-CM | POA: Diagnosis not present

## 2018-06-21 DIAGNOSIS — E259 Adrenogenital disorder, unspecified: Secondary | ICD-10-CM | POA: Diagnosis not present

## 2018-06-21 DIAGNOSIS — E278 Other specified disorders of adrenal gland: Secondary | ICD-10-CM | POA: Diagnosis not present

## 2018-06-21 DIAGNOSIS — N509 Disorder of male genital organs, unspecified: Secondary | ICD-10-CM | POA: Diagnosis not present

## 2018-06-27 ENCOUNTER — Other Ambulatory Visit: Payer: Self-pay | Admitting: Radiology

## 2018-06-27 ENCOUNTER — Telehealth: Payer: Self-pay | Admitting: Radiology

## 2018-06-27 DIAGNOSIS — R351 Nocturia: Secondary | ICD-10-CM

## 2018-06-27 DIAGNOSIS — N401 Enlarged prostate with lower urinary tract symptoms: Secondary | ICD-10-CM

## 2018-06-27 DIAGNOSIS — N210 Calculus in bladder: Secondary | ICD-10-CM

## 2018-06-27 NOTE — Telephone Encounter (Signed)
LM with wife for patient to return call. Need to discuss scheduling surgery with Dr Apolinar Junes.

## 2018-07-04 NOTE — Telephone Encounter (Signed)
Patient states he was referred to [facility] but would like to wait to make appointment due to COVID-19. He states he was referred for a second opinion prior to scheduling surgery. He asked for more information regarding HoLEP procedure and cystolitholapaxy. This information was mailed to the patient.

## 2018-08-20 DIAGNOSIS — I861 Scrotal varices: Secondary | ICD-10-CM | POA: Diagnosis not present

## 2018-08-20 DIAGNOSIS — E278 Other specified disorders of adrenal gland: Secondary | ICD-10-CM | POA: Diagnosis not present

## 2018-08-20 DIAGNOSIS — N5089 Other specified disorders of the male genital organs: Secondary | ICD-10-CM | POA: Diagnosis not present

## 2018-08-20 DIAGNOSIS — N210 Calculus in bladder: Secondary | ICD-10-CM | POA: Diagnosis not present

## 2018-08-20 DIAGNOSIS — N433 Hydrocele, unspecified: Secondary | ICD-10-CM | POA: Diagnosis not present

## 2018-08-20 DIAGNOSIS — R310 Gross hematuria: Secondary | ICD-10-CM | POA: Diagnosis not present

## 2018-08-20 DIAGNOSIS — E259 Adrenogenital disorder, unspecified: Secondary | ICD-10-CM | POA: Diagnosis not present

## 2018-08-20 DIAGNOSIS — N509 Disorder of male genital organs, unspecified: Secondary | ICD-10-CM | POA: Diagnosis not present

## 2018-10-31 ENCOUNTER — Telehealth: Payer: Self-pay | Admitting: Urology

## 2018-10-31 NOTE — Telephone Encounter (Signed)
This pt. Would like for all of his test results and records from [place] office to Dr. Lavera Guise. Pt. States he does not want to schedule an appointment with [facility] until Dr. Lavera Guise reviews all of his records from Urology.

## 2018-11-01 NOTE — Telephone Encounter (Signed)
He is absolutely entitled to his records.  Please have him fill out a release of records we can get them online through my chart.  Patient has already seen [facility] I believe for his adrenal mass and testicular mass.  My understanding is that he was going to have his prostate/bladder outlet obstruction and bladder stone treated by me here at [facility]  If he is still interested in pursuing this with me, I be happy to see him again and discuss further.  If he would like to transfer all of his urologic care to [facility], that is fine to.  Hollice Espy, MD

## 2018-11-08 DIAGNOSIS — N401 Enlarged prostate with lower urinary tract symptoms: Secondary | ICD-10-CM | POA: Diagnosis not present

## 2018-11-08 DIAGNOSIS — I10 Essential (primary) hypertension: Secondary | ICD-10-CM | POA: Diagnosis not present

## 2018-11-08 DIAGNOSIS — R351 Nocturia: Secondary | ICD-10-CM | POA: Diagnosis not present

## 2018-11-08 DIAGNOSIS — R0602 Shortness of breath: Secondary | ICD-10-CM | POA: Diagnosis not present

## 2018-11-09 DIAGNOSIS — H2511 Age-related nuclear cataract, right eye: Secondary | ICD-10-CM | POA: Diagnosis not present

## 2018-11-13 ENCOUNTER — Other Ambulatory Visit: Payer: Self-pay

## 2018-11-13 ENCOUNTER — Telehealth: Payer: Self-pay

## 2018-11-13 DIAGNOSIS — Z1211 Encounter for screening for malignant neoplasm of colon: Secondary | ICD-10-CM

## 2018-11-13 NOTE — Telephone Encounter (Signed)
Gastroenterology Pre-Procedure Review  Request Date: Thursday 11/22/18 Requesting Physician: Dr. Vicente Males  PATIENT REVIEW QUESTIONS: The patient responded to the following health history questions as indicated:    1. Are you having any GI issues? no 2. Do you have a personal history of Polyps? no 3. Do you have a family history of Colon Cancer or Polyps? no 4. Diabetes Mellitus? no 5. Joint replacements in the past 12 months?no 6. Major health problems in the past 3 months?no 7. Any artificial heart valves, MVP, or defibrillator?no    MEDICATIONS & ALLERGIES:    Patient reports the following regarding taking any anticoagulation/antiplatelet therapy:   Plavix, Coumadin, Eliquis, Xarelto, Lovenox, Pradaxa, Brilinta, or Effient? no Aspirin? no  Patient confirms/reports the following medications:  Current Outpatient Medications  Medication Sig Dispense Refill  . acetaminophen (TYLENOL) 325 MG tablet Take 650 mg by mouth daily.    . finasteride (PROSCAR) 5 MG tablet Take 5 mg by mouth daily.    Marland Kitchen lisinopril (PRINIVIL,ZESTRIL) 20 MG tablet Take 20 mg by mouth daily.    . polyethylene glycol powder (GLYCOLAX/MIRALAX) powder 255 grams one bottle for colonoscopy prep 255 g 0   No current facility-administered medications for this visit.     Patient confirms/reports the following allergies:  No Known Allergies  No orders of the defined types were placed in this encounter.   AUTHORIZATION INFORMATION Primary Insurance: 1D#: Group #:  Secondary Insurance: 1D#: Group #:  SCHEDULE INFORMATION: Date: 11/22/18 Time: Location:[facility]

## 2018-11-19 ENCOUNTER — Telehealth: Payer: Self-pay | Admitting: Gastroenterology

## 2018-11-19 ENCOUNTER — Other Ambulatory Visit
Admission: RE | Admit: 2018-11-19 | Payer: Medicare HMO | Source: Ambulatory Visit | Attending: Gastroenterology | Admitting: Gastroenterology

## 2018-11-19 NOTE — Telephone Encounter (Signed)
Patients call returned.  He states that his colonoscopy was just done in 2014, doesn't think its due yet.  I informed him of this upon scheduling as well-he said he will talk to his PCP to see if he needs a repeat one.  He would like to keep it on the schedule and will call me back to let me know if it needs to be canceled.  Thanks [employer]

## 2018-11-19 NOTE — Telephone Encounter (Signed)
Pt is calling to cancel his procedure he spoke with his physician and just had one done 2014 and is not due yet

## 2018-11-22 ENCOUNTER — Encounter: Admission: RE | Payer: Self-pay | Source: Home / Self Care

## 2018-11-22 ENCOUNTER — Ambulatory Visit: Admission: RE | Admit: 2018-11-22 | Payer: Medicare HMO | Source: Home / Self Care | Admitting: Gastroenterology

## 2018-11-22 SURGERY — COLONOSCOPY WITH PROPOFOL
Anesthesia: General

## 2018-11-24 ENCOUNTER — Ambulatory Visit: Admit: 2018-11-24 | Payer: Medicare HMO | Admitting: Urology

## 2018-11-24 SURGERY — CYSTOSCOPY, WITH BLADDER CALCULUS LITHOLAPAXY
Anesthesia: Choice

## 2019-02-24 ENCOUNTER — Ambulatory Visit: Payer: Medicare Other | Attending: Internal Medicine

## 2019-02-24 ENCOUNTER — Ambulatory Visit: Payer: Medicare HMO

## 2019-02-24 DIAGNOSIS — Z23 Encounter for immunization: Secondary | ICD-10-CM | POA: Insufficient documentation

## 2019-02-24 NOTE — Progress Notes (Signed)
   Covid-19 Vaccination Clinic  Name:  KANNAN PROIA    MRN: [id number] DOB: 21-Jul-1945  02/24/2019  Mr. Dolin was observed post Covid-19 immunization for 15 minutes without incidence. He was provided with Vaccine Information Sheet and instruction to access the V-Safe system.   Mr. Scarantino was instructed to call 911 with any severe reactions post vaccine: Marland Kitchen Difficulty breathing  . Swelling of your face and throat  . A fast heartbeat  . A bad rash all over your body  . Dizziness and weakness    Immunizations Administered    Name Date Dose VIS Date Route   Pfizer COVID-19 Vaccine 02/24/2019  1:11 PM 0.3 mL 01/04/2019 Intramuscular   Manufacturer: [employer], [employer]   Lot: [id number]   NDC: [address]-0

## 2019-03-17 ENCOUNTER — Ambulatory Visit: Payer: Medicare Other | Attending: Internal Medicine

## 2019-03-17 DIAGNOSIS — Z23 Encounter for immunization: Secondary | ICD-10-CM | POA: Insufficient documentation

## 2019-03-17 NOTE — Progress Notes (Signed)
   Covid-19 Vaccination Clinic  Name:  KARMELLO ABERCROMBIE    MRN: [id number] DOB: March 28, 1945  03/17/2019  Mr. Fitzgerald was observed post Covid-19 immunization for 15 minutes without incidence. He was provided with Vaccine Information Sheet and instruction to access the V-Safe system.   Mr. Biello was instructed to call 911 with any severe reactions post vaccine: Marland Kitchen Difficulty breathing  . Swelling of your face and throat  . A fast heartbeat  . A bad rash all over your body  . Dizziness and weakness    Immunizations Administered    Name Date Dose VIS Date Route   Pfizer COVID-19 Vaccine 03/17/2019 12:22 PM 0.3 mL 01/04/2019 Intramuscular   Manufacturer: [employer], [employer]   Lot: [id number]   NDC: [address]-8

## 2019-04-29 NOTE — H&P (View-Only) (Signed)
04/30/19 12:56 PM   Albert Gray 1945/03/11 [id number]  Referring provider: Corky Downs, MD [address] [place],  [place] [address]  Chief Complaint  Patient presents with  . Hematuria   HPI: Albert Gray is a 74 y.o. M returns today for the evaluation and management of microscopic/gross hematuria and BPH.    His CT from 03/23/2018 shows a 5.5 cm oval-shaped hyperdensity in the urinary bladder compatible with a large bladder stone. He underwent evaluation including cystoscopy and was booked for holmium laser enucleation of the prostate as well as cystolitholapaxy but elected to defer this in light of COVID-19 concerns.  He returns today to discuss this again.  He was referred to Albert Gray last year for further evaluation of his BL adrenal masses and scrotal mass and was recommended for further imagings. He did not f/u with them due to pandemic.    He reports of worsening urinary symptoms including frequency, nocturia and burning with urination. He is unable to sleep at night due nocturia. Urinary symptoms are not as bad during the day and improves with increased water intake and exercise.   Denies gross hematuria currently however 6 months ago noticed blood on tissue. He denies bladder infections.   PMH: Past Medical History:  Diagnosis Date  . Diabetes mellitus without complication (HCC)   . Hyperlipidemia   . Hypertension     Surgical History: Past Surgical History:  Procedure Laterality Date  . COLONOSCOPY  2002   Dr Evette Gray  . HERNIA REPAIR      Home Medications:  Allergies as of 04/30/2019   No Known Allergies     Medication List       Accurate as of April 30, 2019 12:56 PM. If you have any questions, ask your nurse or doctor.        acetaminophen 325 MG tablet Commonly known as: TYLENOL Take 650 mg by mouth daily.   finasteride 5 MG tablet Commonly known as: PROSCAR Take 5 mg by mouth daily.   furosemide 20 MG tablet Commonly known as: LASIX     ibuprofen 200 MG tablet Commonly known as: ADVIL Take by mouth.   lisinopril 20 MG tablet Commonly known as: ZESTRIL Take 20 mg by mouth daily.   metFORMIN 500 MG tablet Commonly known as: GLUCOPHAGE   polyethylene glycol powder 17 GM/SCOOP powder Commonly known as: GLYCOLAX/MIRALAX 255 grams one bottle for colonoscopy prep   rosuvastatin 20 MG tablet Commonly known as: CRESTOR Take 20 mg by mouth daily.   tamsulosin 0.4 MG Caps capsule Commonly known as: FLOMAX Take 0.4 mg by mouth daily.       Allergies: No Known Allergies  Family History: No family history on file.  Social History:  reports that he has never smoked. He has never used smokeless tobacco. He reports that he does not drink alcohol or use drugs.   Physical Exam: BP (!) 185/99   Pulse 99   Ht 5\' 4"  (1.626 m)   Wt 223 lb (101.2 kg)   BMI 38.28 kg/m   Constitutional:  Alert and oriented, No acute distress. HEENT: [place] AT, moist mucus membranes.  Trachea midline, no masses. Cardiovascular: No clubbing, cyanosis, or edema. Respiratory: Normal respiratory effort, no increased work of breathing. Skin: No rashes, bruises or suspicious lesions. Neurologic: Grossly intact, no focal deficits, moving all 4 extremities. Psychiatric: Normal mood and affect.  Laboratory Data:  Urinalysis UA today has greater than 30 white blood cells, 3-10 red blood cells, see  epic for details   Assessment & Plan:    1. Bilateral adrenal mass F/u with Albert Gray for second opinion  Low suspicion for malignancy given BL in nature and pt's overall health   2. BPH with bladder stones UA with pyuria and microscopic blood probably secondary to bladder stone.  Urine culture sent to rule out infection.  Discussed the treatment of cystolitholapaxy and HoLEP  We reviewed the surgery in detail today including the preoperative, intraoperative, and postoperative course. This will most likely be an outpatient procedure pending the degree  of post op hematuria. He will go home with catheter for a few days post op and will either be taught how to remove his own catheter or return to the office for catheter removal. Risk of bleeding, infection, damage surrounding structures, injury to the bladder/ urethral, bladder neck contracture, ureteral stricture, retrograde ejaculation, stress/ urge incontinence, exacerbation of irritative voiding symptoms were all discussed in detail.     3. Microscopic hematuria Will evaluate after treatment above.    Albert Gray [address], [address] [place], [place] [address] [phone]  I, Albert Gray, am acting as a scribe for Albert Gray,  I have reviewed the above documentation for accuracy and completeness, and I agree with the above.   Albert Sappington, MD  I spent 45 total minutes on the day of the encounter including pre-visit review of the medical record, face-to-face time with the patient, and post visit ordering of labs/imaging/tests.    

## 2019-04-29 NOTE — Progress Notes (Signed)
04/30/19 12:56 PM   Albert Gray 1945/03/11 [id number]  Referring provider: Corky Downs, MD [address] [place],  [place] [address]  Chief Complaint  Patient presents with  . Hematuria   HPI: Albert Gray is a 74 y.o. M returns today for the evaluation and management of microscopic/gross hematuria and BPH.    His CT from 03/23/2018 shows a 5.5 cm oval-shaped hyperdensity in the urinary bladder compatible with a large bladder stone. He underwent evaluation including cystoscopy and was booked for holmium laser enucleation of the prostate as well as cystolitholapaxy but elected to defer this in light of COVID-19 concerns.  He returns today to discuss this again.  He was referred to [facility] year for further evaluation of his BL adrenal masses and scrotal mass and was recommended for further imagings. He did not f/u with them due to pandemic.    He reports of worsening urinary symptoms including frequency, nocturia and burning with urination. He is unable to sleep at night due nocturia. Urinary symptoms are not as bad during the day and improves with increased water intake and exercise.   Denies gross hematuria currently however 6 months ago noticed blood on tissue. He denies bladder infections.   PMH: Past Medical History:  Diagnosis Date  . Diabetes mellitus without complication (HCC)   . Hyperlipidemia   . Hypertension     Surgical History: Past Surgical History:  Procedure Laterality Date  . COLONOSCOPY  2002   Dr Evette Cristal  . HERNIA REPAIR      Home Medications:  Allergies as of 04/30/2019   No Known Allergies     Medication List       Accurate as of April 30, 2019 12:56 PM. If you have any questions, ask your nurse or doctor.        acetaminophen 325 MG tablet Commonly known as: TYLENOL Take 650 mg by mouth daily.   finasteride 5 MG tablet Commonly known as: PROSCAR Take 5 mg by mouth daily.   furosemide 20 MG tablet Commonly known as: LASIX     ibuprofen 200 MG tablet Commonly known as: ADVIL Take by mouth.   lisinopril 20 MG tablet Commonly known as: ZESTRIL Take 20 mg by mouth daily.   metFORMIN 500 MG tablet Commonly known as: GLUCOPHAGE   polyethylene glycol powder 17 GM/SCOOP powder Commonly known as: GLYCOLAX/MIRALAX 255 grams one bottle for colonoscopy prep   rosuvastatin 20 MG tablet Commonly known as: CRESTOR Take 20 mg by mouth daily.   tamsulosin 0.4 MG Caps capsule Commonly known as: FLOMAX Take 0.4 mg by mouth daily.       Allergies: No Known Allergies  Family History: No family history on file.  Social History:  reports that he has never smoked. He has never used smokeless tobacco. He reports that he does not drink alcohol or use drugs.   Physical Exam: BP (!) 185/99   Pulse 99   Ht 5\' 4"  (1.626 m)   Wt 223 lb (101.2 kg)   BMI 38.28 kg/m   Constitutional:  Alert and oriented, No acute distress. HEENT: [place] AT, moist mucus membranes.  Trachea midline, no masses. Cardiovascular: No clubbing, cyanosis, or edema. Respiratory: Normal respiratory effort, no increased work of breathing. Skin: No rashes, bruises or suspicious lesions. Neurologic: Grossly intact, no focal deficits, moving all 4 extremities. Psychiatric: Normal mood and affect.  Laboratory Data:  Urinalysis UA today has greater than 30 white blood cells, 3-10 red blood cells, see  epic for details   Assessment & Plan:    1. Bilateral adrenal mass F/u with [facility] for second opinion  Low suspicion for malignancy given BL in nature and pt's overall health   2. BPH with bladder stones UA with pyuria and microscopic blood probably secondary to bladder stone.  Urine culture sent to rule out infection.  Discussed the treatment of cystolitholapaxy and HoLEP  We reviewed the surgery in detail today including the preoperative, intraoperative, and postoperative course. This will most likely be an outpatient procedure pending the degree  of post op hematuria. He will go home with catheter for a few days post op and will either be taught how to remove his own catheter or return to the office for catheter removal. Risk of bleeding, infection, damage surrounding structures, injury to the bladder/ urethral, bladder neck contracture, ureteral stricture, retrograde ejaculation, stress/ urge incontinence, exacerbation of irritative voiding symptoms were all discussed in detail.     3. Microscopic hematuria Will evaluate after treatment above.    [place] [address] [place], [place] [address] [phone]  I, Lucas Mallow, am acting as a scribe for Dr. Hollice Espy,  I have reviewed the above documentation for accuracy and completeness, and I agree with the above.   Hollice Espy, MD  I spent 45 total minutes on the day of the encounter including pre-visit review of the medical record, face-to-face time with the patient, and post visit ordering of labs/imaging/tests.

## 2019-04-30 ENCOUNTER — Encounter: Payer: Self-pay | Admitting: Urology

## 2019-04-30 ENCOUNTER — Other Ambulatory Visit: Payer: Self-pay | Admitting: Radiology

## 2019-04-30 ENCOUNTER — Ambulatory Visit (INDEPENDENT_AMBULATORY_CARE_PROVIDER_SITE_OTHER): Payer: Medicare Other | Admitting: Urology

## 2019-04-30 ENCOUNTER — Other Ambulatory Visit: Payer: Self-pay

## 2019-04-30 VITALS — BP 185/99 | HR 99 | Ht 64.0 in | Wt 223.0 lb

## 2019-04-30 DIAGNOSIS — N401 Enlarged prostate with lower urinary tract symptoms: Secondary | ICD-10-CM | POA: Diagnosis not present

## 2019-04-30 DIAGNOSIS — N210 Calculus in bladder: Secondary | ICD-10-CM

## 2019-04-30 DIAGNOSIS — R351 Nocturia: Secondary | ICD-10-CM

## 2019-04-30 NOTE — Patient Instructions (Signed)

## 2019-05-01 LAB — URINALYSIS, COMPLETE
Bilirubin, UA: NEGATIVE
Glucose, UA: NEGATIVE
Ketones, UA: NEGATIVE
Nitrite, UA: NEGATIVE
Specific Gravity, UA: 1.01 (ref 1.005–1.030)
Urobilinogen, Ur: 0.2 mg/dL (ref 0.2–1.0)
pH, UA: 5 (ref 5.0–7.5)

## 2019-05-01 LAB — MICROSCOPIC EXAMINATION: WBC, UA: >30 /hpf — AB (ref 0–5)

## 2019-05-06 LAB — CULTURE, URINE COMPREHENSIVE

## 2019-05-08 ENCOUNTER — Encounter
Admission: RE | Admit: 2019-05-08 | Discharge: 2019-05-08 | Disposition: A | Discharge status: Home / Self Care | Payer: Medicare Other | Source: Ambulatory Visit | Attending: Urology | Admitting: Urology

## 2019-05-08 ENCOUNTER — Other Ambulatory Visit: Payer: Self-pay

## 2019-05-08 HISTORY — DX: Unspecified osteoarthritis, unspecified site: M19.90

## 2019-05-08 NOTE — Patient Instructions (Signed)
Your procedure is scheduled on: 05-13-19 [facility] Report to Same Day Surgery 2nd floor medical mall [facility] Entrance-take elevator on left to 2nd floor.  Check in with surgery information desk.) To find out your arrival time please call [phone] between 1PM - 3PM on 05-10-19 FRIDAY  Remember: Instructions that are not followed completely may result in serious medical risk, up to and including death, or upon the discretion of your surgeon and anesthesiologist your surgery may need to be rescheduled.    _x___ 1. Do not eat food after midnight the night before your procedure. NO GUM OR CANDY AFTER MIDNIGHT. You may drink WATER up to 2 hours before you are scheduled to arrive at the hospital for your procedure.  Do not drink WATER within 2 hours of your scheduled arrival to the hospital.  Type 1 and type 2 diabetics should only drink water.   ____Ensure clear carbohydrate drink on the way to the hospital for bariatric patients  ____Ensure clear carbohydrate drink 3 hours before surgery.    __x__ 2. No Alcohol for 24 hours before or after surgery.   __x__3. No Smoking or e-cigarettes for 24 prior to surgery.  Do not use any chewable tobacco products for at least 6 hour prior to surgery   ____  4. Bring all medications with you on the day of surgery if instructed.    __x__ 5. Notify your doctor if there is any change in your medical condition     (cold, fever, infections).    x___6. On the morning of surgery brush your teeth with toothpaste and water.  You may rinse your mouth with mouth wash if you wish.  Do not swallow any toothpaste or mouthwash.   Do not wear jewelry, make-up, hairpins, clips or nail polish.  Do not wear lotions, powders, or perfumes. You may wear deodorant.  Do not shave 48 hours prior to surgery. Men may shave face and neck.  Do not bring valuables to the hospital.    [facility] is not responsible for any belongings or valuables.               Contacts,  dentures or bridgework may not be worn into surgery.  Leave your suitcase in the car. After surgery it may be brought to your room.  For patients admitted to the hospital, discharge time is determined by your treatment team.  _  Patients discharged the day of surgery will not be allowed to drive home.  You will need someone to drive you home and stay with you the night of your procedure.    Please read over the following fact sheets that you were given:   [facility] Preparing for Surgery   _x___ TAKE THE FOLLOWING MEDICATION THE MORNING OF SURGERY WITH A SMALL SIP OF WATER. These include:  1. FLOMAX (TAMSULOSIN)  2. PROSCAR (FINASTERIDE)  3.  4.  5.  6.  ____Fleets enema or Magnesium Citrate as directed.   ____ Use CHG Soap or sage wipes as directed on instruction sheet   ____ Use inhalers on the day of surgery and bring to hospital day of surgery  ____ Stop Metformin and Janumet 2 days prior to surgery.    ____ Take 1/2 of usual insulin dose the night before surgery and none on the morning surgery.   ____ Follow recommendations from Cardiologist, Pulmonologist or PCP regarding stopping Aspirin, Coumadin, Plavix ,Eliquis, Effient, or Pradaxa, and Pletal.  X____Stop Anti-inflammatories such as Advil, Aleve, Ibuprofen,  Motrin, Naproxen, Naprosyn, Goodies powders or aspirin products NOW-OK to take Tylenol    ____ Stop supplements until after surgery.   ____ Bring C-Pap to the hospital.

## 2019-05-09 ENCOUNTER — Other Ambulatory Visit
Admission: RE | Admit: 2019-05-09 | Discharge: 2019-05-09 | Disposition: A | Discharge status: Home / Self Care | Payer: Medicare Other | Source: Ambulatory Visit | Attending: Urology | Admitting: Urology

## 2019-05-09 DIAGNOSIS — Z20822 Contact with and (suspected) exposure to covid-19: Secondary | ICD-10-CM | POA: Insufficient documentation

## 2019-05-09 DIAGNOSIS — Z01812 Encounter for preprocedural laboratory examination: Secondary | ICD-10-CM | POA: Diagnosis present

## 2019-05-09 LAB — SARS CORONAVIRUS 2 (TAT 6-24 HRS): SARS Coronavirus 2: NEGATIVE

## 2019-05-13 ENCOUNTER — Encounter
Admission: RE | Disposition: A | Discharge status: Home / Self Care | Payer: Self-pay | Source: Home / Self Care | Attending: Urology

## 2019-05-13 ENCOUNTER — Ambulatory Visit
Admission: RE | Admit: 2019-05-13 | Discharge: 2019-05-13 | Disposition: A | Discharge status: Home / Self Care | Payer: Medicare Other | Attending: Urology | Admitting: Urology

## 2019-05-13 ENCOUNTER — Encounter: Payer: Self-pay | Admitting: Urology

## 2019-05-13 ENCOUNTER — Other Ambulatory Visit: Payer: Self-pay

## 2019-05-13 ENCOUNTER — Ambulatory Visit: Payer: Medicare Other | Admitting: Anesthesiology

## 2019-05-13 DIAGNOSIS — Z7984 Long term (current) use of oral hypoglycemic drugs: Secondary | ICD-10-CM | POA: Diagnosis not present

## 2019-05-13 DIAGNOSIS — N401 Enlarged prostate with lower urinary tract symptoms: Secondary | ICD-10-CM | POA: Insufficient documentation

## 2019-05-13 DIAGNOSIS — Z79899 Other long term (current) drug therapy: Secondary | ICD-10-CM | POA: Insufficient documentation

## 2019-05-13 DIAGNOSIS — N320 Bladder-neck obstruction: Secondary | ICD-10-CM | POA: Diagnosis not present

## 2019-05-13 DIAGNOSIS — R319 Hematuria, unspecified: Secondary | ICD-10-CM | POA: Insufficient documentation

## 2019-05-13 DIAGNOSIS — N138 Other obstructive and reflux uropathy: Secondary | ICD-10-CM | POA: Diagnosis not present

## 2019-05-13 DIAGNOSIS — R351 Nocturia: Secondary | ICD-10-CM

## 2019-05-13 DIAGNOSIS — E119 Type 2 diabetes mellitus without complications: Secondary | ICD-10-CM | POA: Diagnosis not present

## 2019-05-13 DIAGNOSIS — E785 Hyperlipidemia, unspecified: Secondary | ICD-10-CM | POA: Diagnosis not present

## 2019-05-13 DIAGNOSIS — I10 Essential (primary) hypertension: Secondary | ICD-10-CM | POA: Insufficient documentation

## 2019-05-13 DIAGNOSIS — N210 Calculus in bladder: Secondary | ICD-10-CM | POA: Insufficient documentation

## 2019-05-13 DIAGNOSIS — N3289 Other specified disorders of bladder: Secondary | ICD-10-CM

## 2019-05-13 HISTORY — PX: CYSTOSCOPY WITH LITHOLAPAXY: SHX1425

## 2019-05-13 HISTORY — PX: CYSTOSCOPY WITH FULGERATION: SHX6638

## 2019-05-13 LAB — GLUCOSE, CAPILLARY
Glucose-Capillary: 100 mg/dL — ABNORMAL HIGH (ref 70–99)
Glucose-Capillary: 105 mg/dL — ABNORMAL HIGH (ref 70–99)

## 2019-05-13 SURGERY — CYSTOSCOPY, WITH BLADDER CALCULUS LITHOLAPAXY
Anesthesia: General | Site: Bladder

## 2019-05-13 MED ORDER — ROCURONIUM BROMIDE 100 MG/10ML IV SOLN
INTRAVENOUS | Status: DC | PRN
  Administered 2019-05-13: 50 mg via INTRAVENOUS
  Administered 2019-05-13: 40 mg via INTRAVENOUS
  Administered 2019-05-13: 10 mg via INTRAVENOUS

## 2019-05-13 MED ORDER — FAMOTIDINE 20 MG PO TABS: 20.0000 mg | ORAL_TABLET | Freq: Once | ORAL | Status: AC

## 2019-05-13 MED ORDER — OXYBUTYNIN CHLORIDE 5 MG PO TABS
ORAL_TABLET | ORAL | Status: AC
  Filled 2019-05-13: qty 1

## 2019-05-13 MED ORDER — DEXMEDETOMIDINE HCL IN NACL 200 MCG/50ML IV SOLN
INTRAVENOUS | Status: DC | PRN
  Administered 2019-05-13 (×2): 4 ug via INTRAVENOUS

## 2019-05-13 MED ORDER — SODIUM CHLORIDE 0.9 % IV SOLN: INTRAVENOUS | Status: DC

## 2019-05-13 MED ORDER — FENTANYL CITRATE (PF) 100 MCG/2ML IJ SOLN
INTRAMUSCULAR | Status: AC
  Filled 2019-05-13: qty 2

## 2019-05-13 MED ORDER — PROPOFOL 10 MG/ML IV BOLUS
INTRAVENOUS | Status: DC | PRN
  Administered 2019-05-13: 140 mg via INTRAVENOUS

## 2019-05-13 MED ORDER — OXYBUTYNIN CHLORIDE 5 MG PO TABS: 5.0000 mg | ORAL_TABLET | Freq: Three times a day (TID) | ORAL | 0 refills | Status: DC | PRN

## 2019-05-13 MED ORDER — LIDOCAINE HCL (CARDIAC) PF 100 MG/5ML IV SOSY
PREFILLED_SYRINGE | INTRAVENOUS | Status: DC | PRN
  Administered 2019-05-13: 80 mg via INTRAVENOUS

## 2019-05-13 MED ORDER — CEFAZOLIN SODIUM-DEXTROSE 2-4 GM/100ML-% IV SOLN
2.0000 g | INTRAVENOUS | Status: AC
  Administered 2019-05-13: 11:00:00 2 g via INTRAVENOUS

## 2019-05-13 MED ORDER — PROPOFOL 10 MG/ML IV BOLUS
INTRAVENOUS | Status: AC
  Filled 2019-05-13: qty 40

## 2019-05-13 MED ORDER — ONDANSETRON HCL 4 MG/2ML IJ SOLN
INTRAMUSCULAR | Status: DC | PRN
  Administered 2019-05-13: 4 mg via INTRAVENOUS

## 2019-05-13 MED ORDER — DEXAMETHASONE SODIUM PHOSPHATE 10 MG/ML IJ SOLN
INTRAMUSCULAR | Status: DC | PRN
  Administered 2019-05-13: 5 mg via INTRAVENOUS

## 2019-05-13 MED ORDER — ONDANSETRON HCL 4 MG/2ML IJ SOLN: 4.0000 mg | Freq: Once | INTRAMUSCULAR | Status: DC | PRN

## 2019-05-13 MED ORDER — HYDROCODONE-ACETAMINOPHEN 5-325 MG PO TABS: 1.0000 | ORAL_TABLET | Freq: Four times a day (QID) | ORAL | 0 refills | Status: DC | PRN

## 2019-05-13 MED ORDER — FENTANYL CITRATE (PF) 100 MCG/2ML IJ SOLN: 25.0000 ug | INTRAMUSCULAR | Status: DC | PRN

## 2019-05-13 MED ORDER — SUGAMMADEX SODIUM 200 MG/2ML IV SOLN
INTRAVENOUS | Status: DC | PRN
  Administered 2019-05-13: 200 mg via INTRAVENOUS

## 2019-05-13 MED ORDER — ROCURONIUM BROMIDE 10 MG/ML (PF) SYRINGE
PREFILLED_SYRINGE | INTRAVENOUS | Status: AC
  Filled 2019-05-13: qty 10

## 2019-05-13 MED ORDER — FENTANYL CITRATE (PF) 100 MCG/2ML IJ SOLN
INTRAMUSCULAR | Status: DC | PRN
  Administered 2019-05-13: 50 ug via INTRAVENOUS
  Administered 2019-05-13: 25 ug via INTRAVENOUS
  Administered 2019-05-13: 50 ug via INTRAVENOUS

## 2019-05-13 MED ORDER — FAMOTIDINE 20 MG PO TABS
ORAL_TABLET | ORAL | Status: AC
  Administered 2019-05-13: 20 mg via ORAL
  Filled 2019-05-13: qty 1

## 2019-05-13 MED ORDER — OXYBUTYNIN CHLORIDE 5 MG PO TABS
5.0000 mg | ORAL_TABLET | Freq: Three times a day (TID) | ORAL | Status: DC | PRN
  Administered 2019-05-13: 5 mg via ORAL
  Filled 2019-05-13 (×2): qty 1

## 2019-05-13 MED ORDER — ONDANSETRON HCL 4 MG/2ML IJ SOLN
INTRAMUSCULAR | Status: AC
  Filled 2019-05-13: qty 2

## 2019-05-13 MED ORDER — DEXAMETHASONE SODIUM PHOSPHATE 10 MG/ML IJ SOLN
INTRAMUSCULAR | Status: AC
  Filled 2019-05-13: qty 1

## 2019-05-13 SURGICAL SUPPLY — 34 items
ADAPTER IRRIG TUBE 2 SPIKE SOL (ADAPTER) ×6 IMPLANT
BAG DRAIN CYSTO-URO LG1000N (MISCELLANEOUS) ×3 IMPLANT
BAG URINE DRAIN 2000ML AR STRL (UROLOGICAL SUPPLIES) IMPLANT
BAG URO DRAIN 4000ML (MISCELLANEOUS) ×3 IMPLANT
BASKET ZERO TIP 1.9FR (BASKET) ×3 IMPLANT
CATH FOL 2WAY LX 20X30 (CATHETERS) IMPLANT
CATH FOL 2WAY LX 22X30 (CATHETERS) IMPLANT
CATH FOLEY 3WAY 30CC 22FR (CATHETERS) IMPLANT
CATH URETL 5X70 OPEN END (CATHETERS) ×3 IMPLANT
CONTAINER COLLECT MORCELLATR (MISCELLANEOUS) ×2 IMPLANT
DRAPE 3/4 80X56 (DRAPES) ×3 IMPLANT
DRAPE UTILITY 15X26 TOWEL STRL (DRAPES) ×3 IMPLANT
ELECT LOOP 22F BIPOLAR SML (ELECTROSURGICAL) ×3
ELECTRODE LOOP 22F BIPOLAR SML (ELECTROSURGICAL) ×2 IMPLANT
FILTER OVERFLOW MORCELLATOR (FILTER) ×2 IMPLANT
GLOVE BIO SURGEON STRL SZ 6.5 (GLOVE) ×6 IMPLANT
GOWN STRL REUS W/ TWL LRG LVL3 (GOWN DISPOSABLE) ×4 IMPLANT
GOWN STRL REUS W/TWL LRG LVL3 (GOWN DISPOSABLE) ×2
HOLDER FOLEY CATH W/STRAP (MISCELLANEOUS) IMPLANT
KIT TURNOVER CYSTO (KITS) ×3 IMPLANT
LASER FIBER 550M SMARTSCOPE (Laser) ×3 IMPLANT
MORCELLATOR COLLECT CONTAINER (MISCELLANEOUS) ×3
MORCELLATOR OVERFLOW FILTER (FILTER) ×3
MORCELLATOR ROTATION 4.75 335 (MISCELLANEOUS) ×3 IMPLANT
PACK CYSTO AR (MISCELLANEOUS) ×3 IMPLANT
SET CYSTO W/LG BORE CLAMP LF (SET/KITS/TRAYS/PACK) IMPLANT
SET IRRIG Y TYPE TUR BLADDER L (SET/KITS/TRAYS/PACK) ×3 IMPLANT
SLEEVE PROTECTION STRL DISP (MISCELLANEOUS) ×6 IMPLANT
SOL .9 NS 3000ML IRR  AL (IV SOLUTION) ×4
SOL .9 NS 3000ML IRR UROMATIC (IV SOLUTION) ×8 IMPLANT
SYRINGE IRR TOOMEY STRL 70CC (SYRINGE) ×3 IMPLANT
TUBE PUMP MORCELLATOR PIRANHA (TUBING) ×3 IMPLANT
WATER STERILE IRR 1000ML POUR (IV SOLUTION) ×3 IMPLANT
WATER STERILE IRR 3000ML UROMA (IV SOLUTION) ×48 IMPLANT

## 2019-05-13 NOTE — OR Nursing (Signed)
PT son also at bedside and given discharge instructions to. PT appears weak but family states that is his baseline and son will be home to help pt ambulate. Son and spouse verbalize understanding of discharge instructions and follow up

## 2019-05-13 NOTE — Interval H&P Note (Signed)
History and Physical Interval Note:  05/13/2019 10:23 AM  Albert Gray  has presented today for surgery, with the diagnosis of BPH, bladder stone.  The various methods of treatment have been discussed with the patient and family. After consideration of risks, benefits and other options for treatment, the patient has consented to  Procedure(s): CYSTOSCOPY WITH LITHOLAPAXY (N/A) HOLEP-LASER ENUCLEATION OF THE PROSTATE WITH MORCELLATION (N/A) as a surgical intervention.  The patient's history has been reviewed, patient examined, no change in status, stable for surgery.  I have reviewed the patient's chart and labs.  Questions were answered to the patient's satisfaction.    RR[facility] CTAB  [name]

## 2019-05-13 NOTE — Transfer of Care (Signed)
Immediate Anesthesia Transfer of Care Note  Patient: Albert Gray  Procedure(s) Performed: CYSTOSCOPY WITH LITHOLAPAXY (N/A Bladder) CYSTOSCOPY WITH FULGERATION (N/A Bladder)  Patient Location: PACU  Anesthesia Type:General  Level of Consciousness: drowsy  Airway & Oxygen Therapy: Patient Spontanous Breathing and Patient connected to face mask oxygen  Post-op Assessment: Report given to RN and Post -op Vital signs reviewed and stable  Post vital signs: Reviewed and stable  Last Vitals:  Vitals Value Taken Time  BP 144/83 05/13/19 1409  Temp 36.1 C 05/13/19 1409  Pulse 71 05/13/19 1412  Resp 16 05/13/19 1412  SpO2 100 % 05/13/19 1412  Vitals shown include unvalidated device data.  Last Pain:  Vitals:   05/13/19 1409  TempSrc:   PainSc: Asleep         Complications: No apparent anesthesia complications

## 2019-05-13 NOTE — Anesthesia Procedure Notes (Signed)
Procedure Name: Intubation Date/Time: 05/13/2019 11:06 AM Performed by: Manning Charity, CRNA Pre-anesthesia Checklist: Patient identified, Emergency Drugs available, Suction available and Patient being monitored Patient Re-evaluated:Patient Re-evaluated prior to induction Oxygen Delivery Method: Circle system utilized Preoxygenation: Pre-oxygenation with 100% oxygen Induction Type: IV induction Ventilation: Mask ventilation without difficulty Laryngoscope Size: McGraph and 4 Grade View: Grade III Tube type: Oral Tube size: 7.5 mm Number of attempts: 2 Airway Equipment and Method: Stylet Placement Confirmation: ETT inserted through vocal cords under direct vision,  positive ETCO2 and breath sounds checked- equal and bilateral Secured at: 22 cm Tube secured with: Tape Dental Injury: Teeth and Oropharynx as per pre-operative assessment

## 2019-05-13 NOTE — Anesthesia Preprocedure Evaluation (Signed)
Anesthesia Evaluation  Patient identified by MRN, date of birth, ID band Patient awake    Reviewed: Allergy & Precautions, NPO status , Patient's Chart, lab work & pertinent test results  History of Anesthesia Complications Negative for: history of anesthetic complications  Airway Mallampati: III       Dental   Pulmonary neg sleep apnea, neg COPD, Not current smoker,           Cardiovascular hypertension, Pt. on medications (-) Past MI and (-) CHF (-) dysrhythmias (-) Valvular Problems/Murmurs     Neuro/Psych neg Seizures    GI/Hepatic Neg liver ROS, neg GERD  ,  Endo/Other  diabetes, Type 2, Oral Hypoglycemic Agents  Renal/GU negative Renal ROS     Musculoskeletal   Abdominal   Peds  Hematology   Anesthesia Other Findings   Reproductive/Obstetrics                             Anesthesia Physical Anesthesia Plan  ASA: III  Anesthesia Plan: General   Post-op Pain Management:    Induction: Intravenous  PONV Risk Score and Plan: 2 and Ondansetron and Midazolam  Airway Management Planned: Oral ETT  Additional Equipment:   Intra-op Plan:   Post-operative Plan:   Informed Consent: I have reviewed the patients History and Physical, chart, labs and discussed the procedure including the risks, benefits and alternatives for the proposed anesthesia with the patient or authorized representative who has indicated his/her understanding and acceptance.       Plan Discussed with:   Anesthesia Plan Comments:         Anesthesia Quick Evaluation

## 2019-05-13 NOTE — Discharge Instructions (Signed)
AMBULATORY SURGERY  DISCHARGE INSTRUCTIONS   1) The drugs that you were given will stay in your system until tomorrow so for the next 24 hours you should not:  A) Drive an automobile B) Make any legal decisions C) Drink any alcoholic beverage   2) You may resume regular meals tomorrow.  Today it is better to start with liquids and gradually work up to solid foods.  You may eat anything you prefer, but it is better to start with liquids, then soup and crackers, and gradually work up to solid foods.   3) Please notify your doctor immediately if you have any unusual bleeding, trouble breathing, redness and pain at the surgery site, drainage, fever, or pain not relieved by medication.    4) Additional Instructions:        Please contact your physician with any problems or Same Day Surgery at [phone], Monday through Friday 6 am to 4 pm, or [place] at [facility] number at [phone]. Due to the extremely large size of your bladder stone, a large amount of debris was created and there was some bleeding from the bladder which made visualization somewhat poor.  I did not feel that it was safe to pursue treating your prostate as well today thus only your bladder stone was treated.  This did take several hours to complete to the very large size.  I do expect that you do have some relief with treatment of the bladder stone itself.  We can discuss pursuing surgery for your prostate at a later date.  [name], MD   Indwelling Urinary Catheter Care, Adult An indwelling urinary catheter is a thin tube that is put into your bladder. The tube helps to drain pee (urine) out of your body. The tube goes in through your urethra. Your urethra is where pee comes out of your body. Your pee will come out through the catheter, then it will go into a bag (drainage bag). Take good care of your catheter so it will work well. How to wear your catheter and bag Supplies needed  Sticky tape  (adhesive tape) or a leg strap.  Alcohol wipe or soap and water (if you use tape).  A clean towel (if you use tape).  Large overnight bag.  Smaller bag (leg bag). Wearing your catheter Attach your catheter to your leg with tape or a leg strap.  Make sure the catheter is not pulled tight.  If a leg strap gets wet, take it off and put on a dry strap.  If you use tape to hold the bag on your leg: 1. Use an alcohol wipe or soap and water to wash your skin where the tape made it sticky before. 2. Use a clean towel to pat-dry that skin. 3. Use new tape to make the bag stay on your leg. Wearing your bags You should have been given a large overnight bag.  You may wear the overnight bag in the day or night.  Always have the overnight bag lower than your bladder.  Do not let the bag touch the floor.  Before you go to sleep, put a clean plastic bag in a wastebasket. Then hang the overnight bag inside the wastebasket. You should also have a smaller leg bag that fits under your clothes.  Always wear the leg bag below your knee.  Do not wear your leg bag at night. How to care for your skin and catheter Supplies needed  A clean washcloth.  Water  and mild soap.  A clean towel. Caring for your skin and catheter      Clean the skin around your catheter every day: 1. Wash your hands with soap and water. 2. Wet a clean washcloth in warm water and mild soap. 3. Clean the skin around your urethra.  If you are male:  Gently spread the folds of skin around your vagina (labia).  With the washcloth in your other hand, wipe the inner side of your labia on each side. Wipe from front to back.  If you are male:  Pull back any skin that covers the end of your penis (foreskin).  With the washcloth in your other hand, wipe your penis in small circles. Start wiping at the tip of your penis, then move away from the catheter.  Move the foreskin back in place, if needed. 4. With your  free hand, hold the catheter close to where it goes into your body.  Keep holding the catheter during cleaning so it does not get pulled out. 5. With the washcloth in your other hand, clean the catheter.  Only wipe downward on the catheter.  Do not wipe upward toward your body. Doing this may push germs into your urethra and cause infection. 6. Use a clean towel to pat-dry the catheter and the skin around it. Make sure to wipe off all soap. 7. Wash your hands with soap and water.  Shower every day. Do not take baths.  Do not use cream, ointment, or lotion on the area where the catheter goes into your body, unless your doctor tells you to.  Do not use powders, sprays, or lotions on your genital area.  Check your skin around the catheter every day for signs of infection. Check for: ? Redness, swelling, or pain. ? Fluid or blood. ? Warmth. ? Pus or a bad smell. How to empty the bag Supplies needed  Rubbing alcohol.  Gauze pad or cotton ball.  Tape or a leg strap. Emptying the bag Pour the pee out of your bag when it is ?- full, or at least 2-3 times a day. Do this for your overnight bag and your leg bag. 1. Wash your hands with soap and water. 2. Separate (detach) the bag from your leg. 3. Hold the bag over the toilet or a clean pail. Keep the bag lower than your hips and bladder. This is so the pee (urine) does not go back into the tube. 4. Open the pour spout. It is at the bottom of the bag. 5. Empty the pee into the toilet or pail. Do not let the pour spout touch any surface. 6. Put rubbing alcohol on a gauze pad or cotton ball. 7. Use the gauze pad or cotton ball to clean the pour spout. 8. Close the pour spout. 9. Attach the bag to your leg with tape or a leg strap. 10. Wash your hands with soap and water. Follow instructions for cleaning the drainage bag:  From the product maker.  As told by your doctor. How to change the bag Supplies needed  Alcohol wipes.  A  clean bag.  Tape or a leg strap. Changing the bag Replace your bag when it starts to leak, smell bad, or look dirty. 1. Wash your hands with soap and water. 2. Separate the dirty bag from your leg. 3. Pinch the catheter with your fingers so that pee does not spill out. 4. Separate the catheter tube from the bag tube where these tubes  connect (at the connection valve). Do not let the tubes touch any surface. 5. Clean the end of the catheter tube with an alcohol wipe. Use a different alcohol wipe to clean the end of the bag tube. 6. Connect the catheter tube to the tube of the clean bag. 7. Attach the clean bag to your leg with tape or a leg strap. Do not make the bag tight on your leg. 8. Wash your hands with soap and water. General rules   Never pull on your catheter. Never try to take it out. Doing that can hurt you.  Always wash your hands before and after you touch your catheter or bag. Use a mild, fragrance-free soap. If you do not have soap and water, use hand sanitizer.  Always make sure there are no twists or bends (kinks) in the catheter tube.  Always make sure there are no leaks in the catheter or bag.  Drink enough fluid to keep your pee pale yellow.  Do not take baths, swim, or use a hot tub.  If you are male, wipe from front to back after you poop (have a bowel movement). Contact a doctor if:  Your pee is cloudy.  Your pee smells worse than usual.  Your catheter gets clogged.  Your catheter leaks.  Your bladder feels full. Get help right away if:  You have redness, swelling, or pain where the catheter goes into your body.  You have fluid, blood, pus, or a bad smell coming from the area where the catheter goes into your body.  Your skin feels warm where the catheter goes into your body.  You have a fever.  You have pain in your: ? Belly (abdomen). ? Legs. ? Lower back. ? Bladder.  You see blood in the catheter.  Your pee is pink or red.  You  feel sick to your stomach (nauseous).  You throw up (vomit).  You have chills.  Your pee is not draining into the bag.  Your catheter gets pulled out. Summary  An indwelling urinary catheter is a thin tube that is placed into the bladder to help drain pee (urine) out of the body.  The catheter is placed into the part of the body that drains pee from the bladder (urethra).  Taking good care of your catheter will keep it working properly and help prevent problems.  Always wash your hands before and after touching your catheter or bag.  Never pull on your catheter or try to take it out. This information is not intended to replace advice given to you by your health care provider. Make sure you discuss any questions you have with your health care provider. Document Revised: 05/04/2018 Document Reviewed: 08/26/2016 Elsevier Patient Education  2020 [place]   5) The drugs that you were given will stay in your system until tomorrow so for the next 24 hours you should not:  D) Drive an automobile E) Make any legal decisions F) Drink any alcoholic beverage   6) You may resume regular meals tomorrow.  Today it is better to start with liquids and gradually work up to solid foods.  You may eat anything you prefer, but it is better to start with liquids, then soup and crackers, and gradually work up to solid foods.   7) Please notify your doctor immediately if you have any unusual bleeding, trouble breathing, redness and pain at the surgery site, drainage, fever, or pain  not relieved by medication.    8) Additional Instructions:        Please contact your physician with any problems or Same Day Surgery at [phone], Monday through Friday 6 am to 4 pm, or [place] at [facility] number at [phone].

## 2019-05-13 NOTE — Anesthesia Postprocedure Evaluation (Signed)
Anesthesia Post Note  Patient: Albert Gray  Procedure(s) Performed: CYSTOSCOPY WITH LITHOLAPAXY (N/A Bladder) CYSTOSCOPY WITH FULGERATION (N/A Bladder)  Patient location during evaluation: PACU Anesthesia Type: General Level of consciousness: awake and alert Pain management: pain level controlled Vital Signs Assessment: post-procedure vital signs reviewed and stable Respiratory status: spontaneous breathing and respiratory function stable Cardiovascular status: stable Anesthetic complications: no     Last Vitals:  Vitals:   05/13/19 1424 05/13/19 1433  BP: (!) 141/83   Pulse: 70 73  Resp: 16 16  Temp:    SpO2: 100% 100%    Last Pain:  Vitals:   05/13/19 1433  TempSrc:   PainSc: 0-No pain                 Nadalie Laughner K

## 2019-05-13 NOTE — Op Note (Signed)
Date of procedure: 05/13/19  Preoperative diagnosis:  1. Bladder stone 2. BPH with bladder outlet obstruction  Postoperative diagnosis:  1. Same as above  Procedure: 1. Cystolitholapaxy, stone greater than 5 cm 2. Fulguration of bladder  Surgeon: [name], MD  Anesthesia: General  Complications: None  Intraoperative findings: Very large bladder stone, greater than 5 cm occupying the majority of bladder capacity.  Trabeculated erythematous bladder.  Significant amount of stone debris, losing and poor visualization after stone fragmentation this elected to pursue staged procedure to treat his outlet at a later date.  EBL: 50 cc  Specimens: None  Drains: 20 French two-way Foley catheter  Indication: Albert Gray is a 74 y.o. patient with very large bladder stone, last imaged over a year ago at which time it measured 5 cm in chronic outlet obstruction.  After reviewing the management options for treatment, he elected to proceed with the above surgical procedure(s). We have discussed the potential benefits and risks of the procedure, side effects of the proposed treatment, the likelihood of the patient achieving the goals of the procedure, and any potential problems that might occur during the procedure or recuperation. Informed consent has been obtained.  Description of procedure:  The patient was taken to the operating room and general anesthesia was induced.  The patient was placed in the dorsal lithotomy position, prepped and draped in the usual sterile fashion, and preoperative antibiotics were administered. A preoperative time-out was performed.   A 26 French resectoscope was advanced per urethra into the bladder under direct visualization.  Notably, the prostate had bilobar coaptation was relatively friable.  Upon entering the bladder, a massive stone was identified, at least 5 cm but likely closer to 8 to 9 cm at this point.  Occupied the majority of the bladder capacity  with very low visualization of the remainder of the bladder due to the sheer size of this large stone.  Using a 550 m laser fiber in the settings of 1.9 J and 53 Hz, began fragmenting the stone.  Likely, the stone was relatively soft but due to the enormity of the size, it took almost 2-1/2 hours to completely obliterate the stone.  Larger pieces that were broken off from the major stone fragmented into smaller size and several rounds of additional fragmentation, bladder irrigation and stone evacuation was performed.  Ultimately, there are few areas of bleeding both within the prostatic fossa and as well as from the bladder mucosa itself from the mechanical trauma of stone fragmentation.  I brought in a bipolar loop and fulgurated these areas.  Stone and clot was adherent to the surface of the bladder which had to be scraped off and evacuated.  There is relatively cumbersome.  The bladder itself was heavily trabeculated with some saccules appreciated.  The trigone was somewhat distorted and initially not visualized optimally but I did ultimately visualize the UOs which were free of any injury.  There is also a large amount of stone and clot within the prostatic fossa which I attempted to evacuate.  This point in time, I was able to achieve fairly decent hemostasis.  Do this year length of the procedure taking care of the stone along with poor visualization from debris and ongoing oozing, elected not to pursue an outlet procedure at this time as he felt that this was the most safe and appropriate thing to do and will discuss returning at a later date for a staged procedure.  I placed a 20 [place]  two-way Foley catheter using a catheter guide without difficulty and irrigated the bladder until his light pink.  I did place the Foley balloon on some tension due to presumed prostatic oozing.  The patient was then cleaned and dried, repositioned in supine position, reversed from anesthesia, taken the PACU in stable  condition.  Plan: There were no complications in this case.  Intraoperative findings were discussed with his wife and written out for the patient as well.  Leave Foley catheter in 2 days, return for voiding trial and then discuss staged outlet procedure at a later date.  Hollice Espy, M.D.

## 2019-05-14 ENCOUNTER — Other Ambulatory Visit: Admission: RE | Admit: 2019-05-14 | Payer: Medicare Other | Source: Ambulatory Visit

## 2019-05-15 ENCOUNTER — Ambulatory Visit: Payer: Medicare Other | Admitting: Physician Assistant

## 2019-05-16 ENCOUNTER — Other Ambulatory Visit: Payer: Medicare Other

## 2019-05-17 ENCOUNTER — Ambulatory Visit: Payer: Medicare Other | Admitting: Physician Assistant

## 2019-05-17 ENCOUNTER — Encounter: Payer: Self-pay | Admitting: Physician Assistant

## 2019-05-17 ENCOUNTER — Other Ambulatory Visit: Payer: Self-pay

## 2019-05-17 ENCOUNTER — Ambulatory Visit (INDEPENDENT_AMBULATORY_CARE_PROVIDER_SITE_OTHER): Payer: Medicare Other | Admitting: Physician Assistant

## 2019-05-17 VITALS — BP 150/81 | HR 75 | Ht 65.0 in | Wt 223.0 lb

## 2019-05-17 DIAGNOSIS — Z466 Encounter for fitting and adjustment of urinary device: Secondary | ICD-10-CM | POA: Diagnosis not present

## 2019-05-17 LAB — BLADDER SCAN AMB NON-IMAGING

## 2019-05-17 NOTE — Patient Instructions (Addendum)
1. Restart Flomax (tamsulosin) and finasteride tonight when you get home. 2. Stop taking oxybutynin. 3. Continue stool softeners to stimulate a bowel movement. 4. Use lidocaine jelly at the tip of your penis to relieve irritation. 5. If you develop the inability to urinate, the feeling of a full bladder that you cannot empty, lower abdominal pain, or lower abdominal distention over the weekend, go to the Emergency Department.

## 2019-05-17 NOTE — Progress Notes (Signed)
Fill and Pull Catheter Removal  Patient is present today for a catheter removal.  Patient was cleaned and prepped in a sterile fashion 5ml of sterile water was instilled into the bladder when the patient felt the urge to urinate. 49ml of water was then drained from the balloon.  A 20FR foley cath was removed from the bladder complications were noted as: painful bladder spasm.  Patient was then given some time to void on their own.  Patient cannot void on their own after some time.  Patient tolerated well.  Performed by: Carman Ching, PA-C   Follow up/ Additional notes: Push fluids and RTC this afternoon for PVR.

## 2019-05-17 NOTE — Progress Notes (Signed)
Voiding Trial Afternoon Follow-up  Patient returned to clinic this afternoon for voiding trial follow-up.  He reports drinking approximately 36 ounces of water since this morning and has been able to urinate.  He reports his urinary stream is intermittent, however he does feel that he is emptying his bladder.  He also notes some irritation at the tip of his penis since Foley catheter removal this morning.  He has not taken Flomax or finasteride since before surgery.  He does report postop constipation.  He has been taking oxybutynin postoperatively.  PVR 125 mL.  Results for orders placed or performed in visit on 05/17/19  Bladder Scan (Post Void Residual) in office  Result Value Ref Range   Scan Result     Offered patient conservative management with repeat PVR on Monday morning versus [facility] teaching this afternoon in an attempt to avoid going into retention over the weekend.  He declined [facility] teaching today. Counseled him as follows: 1. Restart Flomax and finasteride today 2. Stop oxybutynin 3. Continue stool softeners to stimulate a bowel movement 4. Use lidocaine jelly to ease irritation at the tip of the penis 5. Proceed to the emergency department over the weekend if you develop the inability to urinate, the feeling of a full bladder that he cannot empty, lower abdominal pain, or lower abdominal distention.  Follow up: Return in about 3 days (around 05/20/2019) for repeat PVR.

## 2019-05-20 ENCOUNTER — Ambulatory Visit (INDEPENDENT_AMBULATORY_CARE_PROVIDER_SITE_OTHER): Payer: Medicare Other | Admitting: Physician Assistant

## 2019-05-20 ENCOUNTER — Other Ambulatory Visit: Payer: Self-pay

## 2019-05-20 DIAGNOSIS — N401 Enlarged prostate with lower urinary tract symptoms: Secondary | ICD-10-CM | POA: Diagnosis not present

## 2019-05-20 DIAGNOSIS — R351 Nocturia: Secondary | ICD-10-CM

## 2019-05-20 LAB — BLADDER SCAN AMB NON-IMAGING: Scan Result: 374

## 2019-05-20 NOTE — Patient Instructions (Signed)
1.Take Flomax twice daily instead of once daily. 2. Come back on Wednesday for another bladder scan.  3. If you develop the inability to urinate and or lower abdominal pain, contact our office immediately or go to ER if outside of office hours.

## 2019-05-20 NOTE — Progress Notes (Signed)
05/20/2019 1:42 PM   Trayton A Agro 09/20/1945 [id number]  CC: Repeat PVR  HPI: Albert Gray is a 74 y.o. male who presents today for repeat PVR.  He is POD 7 from cystolitholapaxy of a >5cm bladder stone and bladder fulguration with Dr. Erlene Quan.  I saw him in clinic 3 days ago for postop voiding trial.  Afternoon PVR 125 mL.  I counseled him to restart Flomax and finasteride, stop oxybutynin, and continue stool softeners for management of postop constipation at that time with plans for repeat PVR today in the absence of a known baseline residual.  Today, patient reports continued urinary intermittency.  He has had several bowel movements since his last visit.  He states his penile discomfort has mostly resolved since his last office visit.  No acute concerns. PVR 35mL.  PMH: Past Medical History:  Diagnosis Date  . Arthritis   . Diabetes mellitus without complication ([place])   . Hyperlipidemia   . Hypertension     Surgical History: Past Surgical History:  Procedure Laterality Date  . COLONOSCOPY  2002   Dr Jamal Collin x 2  . CYSTOSCOPY WITH FULGERATION N/A 05/13/2019   Procedure: CYSTOSCOPY WITH FULGERATION;  Surgeon: Hollice Espy, MD;  Location: [facility] ORS;  Service: Urology;  Laterality: N/A;  . CYSTOSCOPY WITH LITHOLAPAXY N/A 05/13/2019   Procedure: CYSTOSCOPY WITH LITHOLAPAXY;  Surgeon: Hollice Espy, MD;  Location: [facility] ORS;  Service: Urology;  Laterality: N/A;  . HERNIA REPAIR  2000    Home Medications:  Allergies as of 05/20/2019   No Known Allergies     Medication List       Accurate as of May 20, 2019  1:42 PM. If you have any questions, ask your nurse or doctor.        acetaminophen 500 MG tablet Commonly known as: TYLENOL Take 500 mg by mouth daily.   Farxiga 10 MG Tabs tablet Generic drug: dapagliflozin propanediol Take 10 mg by mouth daily.   finasteride 5 MG tablet Commonly known as: PROSCAR Take 5 mg by mouth every morning.     HYDROcodone-acetaminophen 5-325 MG tablet Commonly known as: NORCO/VICODIN Take 1-2 tablets by mouth every 6 (six) hours as needed for moderate pain.   lisinopril 20 MG tablet Commonly known as: ZESTRIL Take 20 mg by mouth as needed (ONLY TAKES IF BP> 130/80).   oxybutynin 5 MG tablet Commonly known as: DITROPAN Take 1 tablet (5 mg total) by mouth every 8 (eight) hours as needed for bladder spasms.   polyethylene glycol 17 g packet Commonly known as: MIRALAX / GLYCOLAX Take 17 g by mouth daily as needed (pain.).   rosuvastatin 20 MG tablet Commonly known as: CRESTOR Take 20 mg by mouth as needed.   Semaglutide 3 MG Tabs Take 3 mg by mouth daily.   tamsulosin 0.4 MG Caps capsule Commonly known as: FLOMAX Take 0.4 mg by mouth daily after breakfast.   VITAMIN C PO Take 1 tablet by mouth daily.       Allergies:  No Known Allergies  Family History: No family history on file.  Social History:   reports that he has never smoked. He has never used smokeless tobacco. He reports that he does not drink alcohol or use drugs.  Physical Exam: There were no vitals taken for this visit.  Constitutional:  Alert and oriented, no acute distress, nontoxic appearing HEENT: [place], AT Cardiovascular: No clubbing, cyanosis, or edema Respiratory: Normal respiratory effort, no increased work of breathing Skin:  No rashes, bruises or suspicious lesions Neurologic: Grossly intact, no focal deficits, moving all 4 extremities Psychiatric: Normal mood and affect  Laboratory Data: Results for orders placed or performed in visit on 05/20/19  Bladder Scan (Post Void Residual) in office  Result Value Ref Range   Scan Result 374    Assessment & Plan:   1. BPH associated with nocturia 74 year old male s/p cystolitholapaxy of a large bladder stone and bladder fulguration here for repeat PVR.  PVR elevated, however baseline is unknown.  I had a lengthy conversation with the patient today.  I  explained that while he is not currently in urinary retention, I am concerned regarding the upward trend in his bladder scan volumes since 3 days ago and stated that he may go into urinary retention without further intervention.  I offered him C[facility] teaching versus conservative management with repeat PVR in 2 days.  Patient declined CIC teaching and elected for repeat PVR.  Counseled him to increase Flomax to twice daily with repeat PVR in clinic in 2 days.  Counseled him to return to the office sooner or proceed to the emergency department if he develops lower abdominal pain and the inability to urinate in the interim.  He expressed understanding. - Bladder Scan (Post Void Residual) in office  Return in about 2 days (around 05/22/2019) for Repeat PVR.  Carman Ching, PA-C  [facility] [address] [place], [place] [address] [phone]

## 2019-05-22 ENCOUNTER — Encounter: Payer: Self-pay | Admitting: Urology

## 2019-05-22 ENCOUNTER — Other Ambulatory Visit: Payer: Self-pay

## 2019-05-22 ENCOUNTER — Ambulatory Visit (INDEPENDENT_AMBULATORY_CARE_PROVIDER_SITE_OTHER): Payer: Medicare Other | Admitting: Urology

## 2019-05-22 VITALS — BP 128/76 | HR 78 | Ht 65.0 in | Wt 223.0 lb

## 2019-05-22 DIAGNOSIS — N138 Other obstructive and reflux uropathy: Secondary | ICD-10-CM | POA: Diagnosis not present

## 2019-05-22 DIAGNOSIS — N210 Calculus in bladder: Secondary | ICD-10-CM | POA: Diagnosis not present

## 2019-05-22 DIAGNOSIS — R339 Retention of urine, unspecified: Secondary | ICD-10-CM | POA: Diagnosis not present

## 2019-05-22 DIAGNOSIS — N401 Enlarged prostate with lower urinary tract symptoms: Secondary | ICD-10-CM

## 2019-05-22 DIAGNOSIS — R310 Gross hematuria: Secondary | ICD-10-CM | POA: Diagnosis not present

## 2019-05-22 LAB — BLADDER SCAN AMB NON-IMAGING: Scan Result: 455

## 2019-05-22 NOTE — Progress Notes (Signed)
05/22/2019 8:55 AM   Albert Gray January 25, 1945 Gray  Referring provider: Corky Downs, Gray [address] [place],  [place] [address]  Chief Complaint  Patient presents with  . Benign Prostatic Hypertrophy    HPI: Albert Gray is a 74 year old male with gross hematuria, BPH with LU TS, bladder stone and incomplete bladder emptying who presents today for follow up PVR.  Gross hematuria (high risk) Non-smoker.  CTU 02/2018 Indistinctness of the right adrenal gland associated with a 6.6 by 6.1 by 6.5 cm mass containing mostly adipose tissue but also with rim calcification and with a central soft tissue density 3.8 by 3.3 by 2.3 cm mass component. There is also some stranding in the adipose tissue.  Inseparable from the indistinct left adrenal gland, which also appears calcified, there is a 14.5 by 11.4 by 9.0 cm mass of primarily with internal adipose tissue but with some hazy stranding along portions of its internal structure as well. The although the left-sided lesion partially abuts the left kidney, I do not see that either lesion is necessarily arising from the kidney, and I view the adrenal glands as a more likely source.  The both kidneys appear otherwise unremarkable. However, there is a 5.5 by 3.7 by 2.5 cm oval-shaped hyperdensity in the urinary bladder compatible with a large bladder stone, with a non distended appearance of the urinary bladder but mild urinary bladder wall thickening which may be from chronic irritation.  Densely rim calcified mass in the left scrotum 5.3 by 4.3 cm, with some internal calcifications as well, I am uncertain whether this is extending from the testicle or is extratesticular.  There is a potential hypodensity associated with the somewhat small right testicle is only partially included on today's exam. Upper normal prostate size.  (Referred to Albert Gray for scrotal masses and adrenal masses)  Cystoscopy with Albert Gray 03/2018 Enlarged prostate with trilobar  coaptation bleeding with manipulation.  Large stone in bladder, visualization is sub-optimal visually impaired.  Elevated bladder neck.  Retroflexion shows large stone in bladder and enlarged prostate.  He has been experiencing intermittent gross hematuria.  BPH WITH LUTS  (prostate and/or bladder) PVR: 455  Previous PVR: 374 Major complaint(s):  Incontinence and gross hematuria.  Denies any dysuria or suprapubic pain.   Currently taking: finasteride 5 mg daily and tamsulosin 0.4 mg twice daily.  Denies any recent fevers, chills, nausea or vomiting.    Bladder stone Underwent cystolitholapaxy (stone > 5 cm) with fulguration of bladder on 05/13/2019 with Albert Gray  Incomplete bladder emptying PVR's continue to increase  Results for Albert Gray, Albert Gray (Albert [id number]) as of 05/30/2019 08:14  Ref. Range 03/09/2018 15:27 05/17/2019 15:01 05/20/2019 10:42 05/22/2019 11:05  Scan Result Unknown 62ml [id number]     PMH: Past Medical History:  Diagnosis Date  . Arthritis   . Diabetes mellitus without complication (HCC)   . Hyperlipidemia   . Hypertension     Surgical History: Past Surgical History:  Procedure Laterality Date  . COLONOSCOPY  2002   Dr Evette Cristal x 2  . CYSTOSCOPY WITH FULGERATION N/A 05/13/2019   Procedure: CYSTOSCOPY WITH FULGERATION;  Surgeon: Albert Scotland, Gray;  Location: [facility];  Service: Urology;  Laterality: N/A;  . CYSTOSCOPY WITH LITHOLAPAXY N/A 05/13/2019   Procedure: CYSTOSCOPY WITH LITHOLAPAXY;  Surgeon: Albert Scotland, Gray;  Location: [facility];  Service: Urology;  Laterality: N/A;  . HERNIA REPAIR  2000    Home Medications:  Allergies as of  05/22/2019   No Known Allergies     Medication List       Accurate as of May 22, 2019 11:59 PM. If you have any questions, ask your nurse or doctor.        acetaminophen 500 MG tablet Commonly known as: TYLENOL Take 500 mg by mouth daily.   Farxiga 10 MG Tabs tablet Generic drug: dapagliflozin propanediol Take 10  mg by mouth daily.   finasteride 5 MG tablet Commonly known as: PROSCAR Take 5 mg by mouth every morning.   HYDROcodone-acetaminophen 5-325 MG tablet Commonly known as: NORCO/VICODIN Take 1-2 tablets by mouth every 6 (six) hours as needed for moderate pain.   lisinopril 20 MG tablet Commonly known as: ZESTRIL Take 20 mg by mouth as needed (ONLY TAKES IF BP> 130/80).   oxybutynin 5 MG tablet Commonly known as: DITROPAN Take 1 tablet (5 mg total) by mouth every 8 (eight) hours as needed for bladder spasms.   polyethylene glycol 17 g packet Commonly known as: MIRALAX / GLYCOLAX Take 17 g by mouth daily as needed (pain.).   rosuvastatin 20 MG tablet Commonly known as: CRESTOR Take 20 mg by mouth as needed.   Semaglutide 3 MG Tabs Take 3 mg by mouth daily.   tamsulosin 0.4 MG Caps capsule Commonly known as: FLOMAX Take 0.4 mg by mouth daily after breakfast.   VITAMIN C PO Take 1 tablet by mouth daily.       Allergies: No Known Allergies  Family History: History reviewed. No pertinent family history.  Social History:  reports that he has never smoked. He has never used smokeless tobacco. He reports that he does not drink alcohol or use drugs.  ROS: Pertinent ROS in HPI  Physical Exam: BP 128/76   Pulse 78   Ht 5\' 5"  (1.651 m)   Wt 223 lb (101.2 kg)   BMI 37.11 kg/m   Constitutional:  Well nourished. Alert and oriented, No acute distress. HEENT: [place] AT, mask in place.  Trachea midline. Cardiovascular: No clubbing, cyanosis, or edema. Respiratory: Normal respiratory effort, no increased work of breathing. Neurologic: Grossly intact, no focal deficits, moving all 4 extremities. Psychiatric: Normal mood and affect.  Laboratory Data: Lab Results  Component Value Date   CREATININE 1.26 03/09/2018   Urinalysis    Component Value Date/Time   APPEARANCEUR Cloudy (A) 04/30/2019 1133   GLUCOSEU Negative 04/30/2019 1133   BILIRUBINUR Negative 04/30/2019 1133    PROTEINUR 1+ (A) 04/30/2019 1133   NITRITE Negative 04/30/2019 1133   LEUKOCYTESUR 3+ (A) 04/30/2019 1133  I have reviewed the labs.   Pertinent Imaging: Results for Albert Gray, Albert Gray (Albert Albert Gray) as of 05/30/2019 08:14  Ref. Range 05/22/2019 11:05  Scan Result Unknown 455   Assessment & Plan:    1. Incomplete bladder emptying Patient with limited medical insight into his situation as we continue to have a circular conversation regarding his high residuals.  He does believe that since he is voiding and not having any discomfort he is doing well.  I tried to explain that having large residuals can result in damage to his kidneys, damage to his bladder and put him at risk for urinary tract infections.   I offered to instruct him on CIC or place a Foley catheter to decompress the bladder, but he felt this was not necessary and that the medication he was taking (finasteride and tamsulosin) would continue to work.   I explained that the medication was not working with  the evidence of being his high residuals and because of this he would need a bladder outlet procedure.  He did agree to meet with Dr. Erlene Quan to discuss this further and he has scheduled that appointment.  2. Gross hematuria Secondary to stone debris and hypervascular prostate Plans to proceed with bladder outlet procedure in the near future  3. BPH with LUTS - Bladder Scan (Post Void Residual) in office - see #1  4. Bladder stone S/P cystolitholapaxy 05/13/2019   Return for return for appointment with Dr. Erlene Quan to discuss bladder outlet procedure .  These notes generated with voice recognition software. I apologize for typographical errors.  Zara Council, PA-C  [facility] [address]  [place], [place] [address] [phone]

## 2019-06-04 NOTE — Progress Notes (Signed)
06/05/19 1:15 PM   Siegfried Darryll Capers 1945-11-13 [id number]  Referring provider: Corky Downs, MD [address] [place],  [place] [address] Chief Complaint  Patient presents with  . Benign Prostatic Hypertrophy    HPI: Albert Gray is a 74 y.o. M who returns today for the evaluation and management of microscopic/gross hematuria and BPH.   His CT from 03/23/2018 shows a 5.5 cm oval-shaped hyperdensity in the urinary bladder compatible with a largebladder stone. He underwent evaluation including cystoscopy and was booked for holmium laser enucleation of the prostate as well as cystolitholapaxy but elected to defer this in light of COVID-19 concerns.  He returns today to discuss this again.  On previous visit he reported of  worsening urinary symptoms including frequency, nocturia and burning with urination. He is unable to sleep at night due nocturia. Urinary symptoms are not as bad during the day and improves with increased water intake and exercise. He denied gross hematuria however 6 months ago noticed blood on tissue. He denies bladder infections.   Underwent cystolitholapaxy (stone > 5 cm) with fulguration of bladder on 05/13/2019   For his postoperative course, he was discharged w/ a foley catheter which was removed followed by an elevated PVR. His Flomax was increased to twice daily and later came back for another PVR and at that time, PVR was 455 mL.   He reports of being 75% better and is satisfied. He has seen significant improvement in his nocturia and frequency. He states of no incontinence. He is currently on Flomax 1x a day. PVR 30 mL.  Very please with results.  PMH: Past Medical History:  Diagnosis Date  . Arthritis   . Diabetes mellitus without complication (HCC)   . Hyperlipidemia   . Hypertension     Surgical History: Past Surgical History:  Procedure Laterality Date  . COLONOSCOPY  2002   Dr Evette Cristal x 2  . CYSTOSCOPY WITH FULGERATION N/A 05/13/2019   Procedure:  CYSTOSCOPY WITH FULGERATION;  Surgeon: [name], MD;  Location: [facility];  Service: Urology;  Laterality: N/A;  . CYSTOSCOPY WITH LITHOLAPAXY N/A 05/13/2019   Procedure: CYSTOSCOPY WITH LITHOLAPAXY;  Surgeon: [name], MD;  Location: [facility];  Service: Urology;  Laterality: N/A;  . HERNIA REPAIR  2000    Home Medications:  Allergies as of 06/05/2019   No Known Allergies     Medication List       Accurate as of Jun 05, 2019 11:59 PM. If you have any questions, ask your nurse or doctor.        acetaminophen 500 MG tablet Commonly known as: TYLENOL Take 500 mg by mouth daily.   Farxiga 10 MG Tabs tablet Generic drug: dapagliflozin propanediol Take 10 mg by mouth daily.   finasteride 5 MG tablet Commonly known as: PROSCAR Take 5 mg by mouth every morning.   HYDROcodone-acetaminophen 5-325 MG tablet Commonly known as: NORCO/VICODIN Take 1-2 tablets by mouth every 6 (six) hours as needed for moderate pain.   lisinopril 20 MG tablet Commonly known as: ZESTRIL Take 20 mg by mouth as needed (ONLY TAKES IF BP> 130/80).   oxybutynin 5 MG tablet Commonly known as: DITROPAN Take 1 tablet (5 mg total) by mouth every 8 (eight) hours as needed for bladder spasms.   polyethylene glycol 17 g packet Commonly known as: MIRALAX / GLYCOLAX Take 17 g by mouth daily as needed (pain.).   rosuvastatin 20 MG tablet Commonly known as: CRESTOR Take 20 mg by  mouth as needed.   Semaglutide 3 MG Tabs Take 3 mg by mouth daily.   tamsulosin 0.4 MG Caps capsule Commonly known as: FLOMAX Take 0.4 mg by mouth daily after breakfast.   VITAMIN C PO Take 1 tablet by mouth daily.       Allergies: No Known Allergies  Family History: No family history on file.  Social History:  reports that he has never smoked. He has never used smokeless tobacco. He reports that he does not drink alcohol or use drugs.   Physical Exam: BP (!) 145/79   Pulse 90   Constitutional:  Alert and  oriented, No acute distress. Accompanied by wife today. HEENT: [place] AT, moist mucus membranes.  Trachea midline, no masses. Cardiovascular: No clubbing, cyanosis, or edema. Respiratory: Normal respiratory effort, no increased work of breathing. Skin: No rashes, bruises or suspicious lesions. Neurologic: Grossly intact, no focal deficits, moving all 4 extremities. Psychiatric: Normal mood and affect.  Laboratory Data:  Urinalysis UA - c/w recent surgery  Pertinent Imaging: Results for orders placed or performed in visit on 06/05/19  Microscopic Examination   URINE  Result Value Ref Range   WBC, UA 11-30 (A) 0 - 5 /hpf   RBC >30 (A) 0 - 2 /hpf   Epithelial Cells (non renal) 0-10 0 - 10 /hpf   Bacteria, UA None seen None seen/Few  Urinalysis, Complete  Result Value Ref Range   Specific Gravity, UA 1.020 1.005 - 1.030   pH, UA 7.0 5.0 - 7.5   Color, UA Yellow Yellow   Appearance Ur Clear Clear   Leukocytes,UA Trace (A) Negative   Protein,UA Negative Negative/Trace   Glucose, UA Negative Negative   Ketones, UA Negative Negative   RBC, UA 2+ (A) Negative   Bilirubin, UA Negative Negative   Urobilinogen, Ur 0.2 0.2 - 1.0 mg/dL   Nitrite, UA Negative Negative   Microscopic Examination See below:   BLADDER SCAN AMB NON-IMAGING  Result Value Ref Range   Scan Result 30     Assessment & Plan:    1. BPH w/ stones S/p cystolitholapaxy w/ fulguration of bladder on 05/13/19 Adequate emptying of bladder today which is reassuring Significant improvement in urinary symptoms  Continue Flomax 1x at night, finasteride Declined outlet procedure since he is doing well  Will continue to follow closely  Return in 2 months for IPSS/PVR/PSA   2. Incomplete bladder emtpy Dramatic improvement in PVR today Warning symptoms reviewed  3. Urinary incontinence  Resolved   [facility] [address], [place], [place] [address] [phone]  I, Lucas Mallow, am acting as a scribe for Dr. Hollice Espy,  I have reviewed the above documentation for accuracy and completeness, and I agree with the above.   Hollice Espy, MD

## 2019-06-05 ENCOUNTER — Ambulatory Visit (INDEPENDENT_AMBULATORY_CARE_PROVIDER_SITE_OTHER): Payer: Medicare Other | Admitting: Urology

## 2019-06-05 ENCOUNTER — Other Ambulatory Visit: Payer: Self-pay

## 2019-06-05 VITALS — BP 145/79 | HR 90

## 2019-06-05 DIAGNOSIS — N138 Other obstructive and reflux uropathy: Secondary | ICD-10-CM | POA: Diagnosis not present

## 2019-06-05 DIAGNOSIS — R339 Retention of urine, unspecified: Secondary | ICD-10-CM | POA: Diagnosis not present

## 2019-06-05 DIAGNOSIS — N401 Enlarged prostate with lower urinary tract symptoms: Secondary | ICD-10-CM

## 2019-06-05 LAB — BLADDER SCAN AMB NON-IMAGING: Scan Result: 30

## 2019-06-06 LAB — URINALYSIS, COMPLETE
Bilirubin, UA: NEGATIVE
Glucose, UA: NEGATIVE
Ketones, UA: NEGATIVE
Nitrite, UA: NEGATIVE
Protein,UA: NEGATIVE
Specific Gravity, UA: 1.02 (ref 1.005–1.030)
Urobilinogen, Ur: 0.2 mg/dL (ref 0.2–1.0)
pH, UA: 7 (ref 5.0–7.5)

## 2019-06-06 LAB — MICROSCOPIC EXAMINATION
Bacteria, UA: NONE SEEN
RBC, Urine: >30 /hpf — AB (ref 0–2)

## 2019-07-02 ENCOUNTER — Ambulatory Visit: Payer: Medicare Other | Admitting: Urology

## 2019-07-30 ENCOUNTER — Other Ambulatory Visit: Payer: Self-pay | Admitting: *Deleted

## 2019-07-30 DIAGNOSIS — N138 Other obstructive and reflux uropathy: Secondary | ICD-10-CM

## 2019-07-31 ENCOUNTER — Other Ambulatory Visit: Payer: Self-pay

## 2019-07-31 ENCOUNTER — Other Ambulatory Visit: Payer: Medicare Other

## 2019-07-31 DIAGNOSIS — N401 Enlarged prostate with lower urinary tract symptoms: Secondary | ICD-10-CM

## 2019-07-31 DIAGNOSIS — N138 Other obstructive and reflux uropathy: Secondary | ICD-10-CM

## 2019-08-01 LAB — PSA: Prostate Specific Ag, Serum: 2.2 ng/mL (ref 0.0–4.0)

## 2019-08-06 NOTE — Progress Notes (Signed)
08/07/2019 3:15 PM   Albert Gray 09-19-1945 [id number]  Referring provider: Corky Downs, MD [address] [place],  [place] [address] Chief Complaint  Patient presents with  . Benign Prostatic Hypertrophy    44mo w/PVR    HPI: Albert Gray is a 74 y.o. male with a history of BPH returns for a 2 month follow up.   His CT from 03/23/2018 shows a 5.5 cm oval-shaped hyperdensity in the urinary bladder compatible with a largebladder stone.He underwent evaluation including cystoscopy and was booked for holmium laser enucleation of the prostate as well as cystolitholapaxy but elected to defer this in light of COVID-19 concerns. He returns today to discuss this again.  On previous visit he reported of worsening urinary symptoms including frequency, nocturia and burning with urination. He is unable to sleep at night due nocturia. Urinary symptoms are not as bad during the day and improves with increased water intake and exercise. He denied gross hematuria however 6 months ago noticed blood on tissue. He denies bladder infections.  Underwent cystolitholapaxy (stone > 5 cm) with fulguration of bladder on 05/13/2019  For his postoperative course, he was discharged w/ a foley catheter which was removed followed by an elevated PVR. His Flomax was increased to twice daily and later came back for another PVR and at that time, PVR was 455 mL.   Patient saw Dr. Jodie Echevaria at [facility] on 07/08/2019. extratesticular masses had stable lesion malignant process less likely though this was very hard/firm. No changes per patient for 60 year. Dr. Jodie Echevaria will continue to monitor with repeat scrotal US. His adrenal masses appeared more consistent with myeloipomas and patient was currently asymptomatic.   CT A/P on 07/16/2019 showed similar appearance of the bilateral macroscopic fat-containing adrenal lesions, which remain compatible with myelolipomas. Calcified left hemiscrotal mass. Correlate with results of  contemporaneous scrotal ultrasound to better differentiate between testicular and extratesticular mass.   PVR is 0 mL  Today he is doing well. No concerns today   PSA Trend: 2.4, 03/09/18 2.2, 7/721   IPSS    Row Name 08/07/19 1500         International Prostate Symptom Score   How often have you had to urinate less than every two hours? Less than half the time     How often have you found you stopped and started again several times when you urinated? Not at All     How often have you found it difficult to postpone urination? Not at All     How often have you had a weak urinary stream? Not at All     How often have you had to strain to start urination? Not at All     How many times did you typically get up at night to urinate? None     Total IPSS Score 2            Score: 2 1-7 Mild 8-19 Moderate 20-35 Severe   PMH: Past Medical History:  Diagnosis Date  . Arthritis   . Diabetes mellitus without complication (HCC)   . Hyperlipidemia   . Hypertension     Surgical History: Past Surgical History:  Procedure Laterality Date  . COLONOSCOPY  2002   Dr Evette Cristal x 2  . CYSTOSCOPY WITH FULGERATION N/A 05/13/2019   Procedure: CYSTOSCOPY WITH FULGERATION;  Surgeon: [name], MD;  Location: [facility];  Service: Urology;  Laterality: N/A;  . CYSTOSCOPY WITH LITHOLAPAXY N/A 05/13/2019   Procedure: CYSTOSCOPY  WITH LITHOLAPAXY;  Surgeon: [name], MD;  Location: [facility];  Service: Urology;  Laterality: N/A;  . HERNIA REPAIR  2000    Home Medications:  Allergies as of 08/07/2019   No Known Allergies     Medication List       Accurate as of August 07, 2019  3:15 PM. If you have any questions, ask your nurse or doctor.        STOP taking these medications   HYDROcodone-acetaminophen 5-325 MG tablet Commonly known as: NORCO/VICODIN Stopped by: [name], MD     TAKE these medications   acetaminophen 500 MG tablet Commonly known as: TYLENOL Take 500  mg by mouth daily.   Farxiga 10 MG Tabs tablet Generic drug: dapagliflozin propanediol Take 10 mg by mouth daily.   finasteride 5 MG tablet Commonly known as: PROSCAR Take 5 mg by mouth every morning.   lisinopril 20 MG tablet Commonly known as: ZESTRIL Take 20 mg by mouth as needed (ONLY TAKES IF BP> 130/80).   oxybutynin 5 MG tablet Commonly known as: DITROPAN Take 1 tablet (5 mg total) by mouth every 8 (eight) hours as needed for bladder spasms.   polyethylene glycol 17 g packet Commonly known as: MIRALAX / GLYCOLAX Take 17 g by mouth daily as needed (pain.).   rosuvastatin 20 MG tablet Commonly known as: CRESTOR Take 20 mg by mouth as needed.   Semaglutide 3 MG Tabs Take 3 mg by mouth daily.   tamsulosin 0.4 MG Caps capsule Commonly known as: FLOMAX Take 0.4 mg by mouth daily after breakfast.   VITAMIN C PO Take 1 tablet by mouth daily.       Allergies: No Known Allergies  Family History: No family history on file.  Social History:  reports that he has never smoked. He has never used smokeless tobacco. He reports that he does not drink alcohol and does not use drugs.   Physical Exam: BP (!) 153/84   Pulse 86   Ht 5\' 6"  (1.676 m)   Wt 215 lb (97.5 kg)   BMI 34.70 kg/m   Constitutional:  Alert and oriented, No acute distress. HEENT: [place] AT, moist mucus membranes.  Trachea midline, no masses. Cardiovascular: No clubbing, cyanosis, or edema. Respiratory: Normal respiratory effort, no increased work of breathing. Skin: No rashes, bruises or suspicious lesions. Neurologic: Grossly intact, no focal deficits, moving all 4 extremities. Psychiatric: Normal mood and affect.   Pertinent Imaging: Results for orders placed or performed in visit on 08/07/19  BLADDER SCAN AMB NON-IMAGING  Result Value Ref Range   Scan Result 80ml    Interface, Rad Results In - 07/16/2019 5:44 PM EDT  Formatting of this note might be different from the original.  EXAM: CT  ABDOMEN PELVIS W CONTRAST  DATE: 07/16/2019 3:42 PM  ACCESSION: 07/18/2019 UN  DICTATED: 07/16/2019 4:03 PM  INTERPRETATION LOCATION: [facility]   CLINICAL INDICATION: adrenal mass ; Benign neoplasm, soft tissue, retroperitoneum or peritoneum - E27.8 - Adrenal mass greater than 4 cm in diameter (CMS - HCC) - 07/18/2019 - Abnormal radiologic findings on diagnostic imaging of left testicle    COMPARISON: CT abdomen 08/20/2018, outside CT abdomen and pelvis 03/24/2019   TECHNIQUE: A spiral CT scan of the abdomen and pelvis was obtained with IV contrast from the lung bases through the pubic symphysis. Images were reconstructed in the axial plane. Coronal and sagittal reformatted images were also provided for further evaluation.   FINDINGS:   LINES AND TUBES:  None.   LOWER THORAX: Please see concurrent CT chest.   HEPATOBILIARY: No focal hepatic lesions. The gallbladder is present and otherwise unremarkable. No biliary dilatation.   SPLEEN: Unchanged subcentimeter fat-containing lesion in the mid spleen.  PANCREAS: Redemonstrated anterior displacement of the body and tail due to mass effect from the large renal lesion. Otherwise unremarkable.   ADRENALS: Unchanged macroscopic fat-containing left adrenal lesion measuring ~13.6 x 10.3 cm, previously 14.0 x 10.5 cm. The right adrenal lesion measures 6.0 x 5.5 cm, previously 5.9 x 5.8 cm; the hypodense solid component along the inferior aspect of the lesion is also similar prior, measuring approximately 4 cm across.  KIDNEYS/URETERS: Unremarkable.   BLADDER: Unremarkable.  PELVIC/REPRODUCTIVE ORGANS: Mild prostatomegaly, unchanged. Enlarged left hemiscrotal mass with coarse internal and peripheral calcifications. Smaller intermediate attenuation ovoid nodule in the left inferior hemiscrotum. Surgical clips at the inguinal canals bilaterally.   GI TRACT: No dilated or thick walled loops of bowel.   PERITONEUM/RETROPERITONEUMAND MESENTERY: No free  air or fluid.  LYMPH NODES: No enlarged lymph nodes.  VESSELS: The aorta is normal in caliber. Scattered calcified atherosclerotic disease. The portal venous system is patent. The hepatic veins and IVC are unremarkable.   BONESAND SOFT TISSUES: Unremarkable.    IMPRESSION:  --Similar appearance of the bilateral macroscopic fat-containing adrenal lesions, which remain compatible with myelolipomas.   --Calcified left hemiscrotal mass. Correlate with results of contemporaneous scrotal ultrasound to better differentiate between testicular and extratesticular mass.   --Additional chronic and incidental findings as noted above. Please see concurrent CT chest for additional evaluation above the diaphragm. Specimen Collected: 07/16/19 4:03 PM Last Resulted: 07/16/19 5:43 PM  Received From: [facility]  Result Received: 08/01/19 2:03 PM        Assessment & Plan:    1. BPH with bladder outlet obstruction S/p cystolitholapaxy w/ fulguration of bladder on 05/13/19 Adequate emptying of bladder today, PVR is 0 mL. Symptomatic improvement IPSS was 2, mild. PSA is stable  Defer outlet procedure given marked improvement in his urinary symptoms after bladder stone treated  2. History of stone Doing well. Continue Flomax and finasteride-high risk for recurrence of bladder stone and/or obstructive urinary symptoms given he is elected not to pursue an outlet procedure at this time  3. Adrenal/Scrotal mass Being followed at [facility] with Dr. Jodie Echevaria. Imaging and notes reviewed today from [facility] extensively   Follow up in 1 year IPSS/ [facility]   [facility] [address], [address] [place], [place] [address] [phone]  I, Theador Hawthorne, am acting as a scribe for Dr. [name].  I have reviewed the above documentation for accuracy and completeness, and I agree with the above.   [name], MD  I spent 30 total minutes on the day of the encounter  including pre-visit review of the medical record, face-to-face time with the patient, and post visit ordering of labs/imaging/tests.

## 2019-08-07 ENCOUNTER — Other Ambulatory Visit: Payer: Self-pay

## 2019-08-07 ENCOUNTER — Encounter: Payer: Self-pay | Admitting: Urology

## 2019-08-07 ENCOUNTER — Ambulatory Visit (INDEPENDENT_AMBULATORY_CARE_PROVIDER_SITE_OTHER): Payer: Medicare Other | Admitting: Urology

## 2019-08-07 VITALS — BP 153/84 | HR 86 | Ht 66.0 in | Wt 215.0 lb

## 2019-08-07 DIAGNOSIS — N138 Other obstructive and reflux uropathy: Secondary | ICD-10-CM

## 2019-08-07 DIAGNOSIS — N401 Enlarged prostate with lower urinary tract symptoms: Secondary | ICD-10-CM | POA: Diagnosis not present

## 2019-08-07 LAB — BLADDER SCAN AMB NON-IMAGING

## 2019-11-01 ENCOUNTER — Other Ambulatory Visit: Payer: Self-pay

## 2019-11-01 MED ORDER — GLUCOSE BLOOD VI STRP: ORAL_STRIP | 12 refills | Status: DC

## 2019-11-04 ENCOUNTER — Ambulatory Visit (INDEPENDENT_AMBULATORY_CARE_PROVIDER_SITE_OTHER): Payer: Medicare Other | Admitting: Internal Medicine

## 2019-11-04 ENCOUNTER — Other Ambulatory Visit: Payer: Self-pay

## 2019-11-04 ENCOUNTER — Encounter: Payer: Self-pay | Admitting: Internal Medicine

## 2019-11-04 VITALS — BP 180/85 | HR 94 | Ht 66.0 in | Wt 218.5 lb

## 2019-11-04 DIAGNOSIS — R7303 Prediabetes: Secondary | ICD-10-CM | POA: Diagnosis not present

## 2019-11-04 DIAGNOSIS — E669 Obesity, unspecified: Secondary | ICD-10-CM

## 2019-11-04 DIAGNOSIS — Z23 Encounter for immunization: Secondary | ICD-10-CM

## 2019-11-04 DIAGNOSIS — Z0000 Encounter for general adult medical examination without abnormal findings: Secondary | ICD-10-CM | POA: Diagnosis not present

## 2019-11-04 DIAGNOSIS — I10 Essential (primary) hypertension: Secondary | ICD-10-CM | POA: Diagnosis not present

## 2019-11-04 LAB — GLUCOSE, POCT (MANUAL RESULT ENTRY): POC Glucose: 174 mg/dl — AB (ref 70–99)

## 2019-11-04 MED ORDER — GLUCOSE BLOOD VI STRP: ORAL_STRIP | 12 refills | Status: DC

## 2019-11-04 MED ORDER — LISINOPRIL-HYDROCHLOROTHIAZIDE 20-12.5 MG PO TABS: 1.0000 | ORAL_TABLET | Freq: Every day | ORAL | 3 refills | Status: DC

## 2019-11-04 NOTE — Progress Notes (Signed)
Established Patient Office Visit  SUBJECTIVE:  Subjective  Patient ID: Albert Gray, male    DOB: 1945-11-23  Age: 74 y.o. MRN: [id number]  CC:  Chief Complaint  Patient presents with  . Annual Exam    HPI Albert Gray is a 74 y.o. male presenting today for an annual exam.   He continues to take his medication as prescribed and without any difficulty. He denies any missed doses.   He has some cataracts in both eyes. She does express that he has some shortness of breath on exertion. He also has some osteoarthritis in his knee, for which he uses a cane to walk to help relieve some of the pressure from his leg.  He has nocturia and is followed by Dr. Erlene Quan in urology.     Past Medical History:  Diagnosis Date  . Arthritis   . Diabetes mellitus without complication ([place])   . Hyperlipidemia   . Hypertension     Past Surgical History:  Procedure Laterality Date  . COLONOSCOPY  2002   Dr Jamal Collin x 2  . CYSTOSCOPY WITH FULGERATION N/A 05/13/2019   Procedure: CYSTOSCOPY WITH FULGERATION;  Surgeon: Hollice Espy, MD;  Location: [facility] ORS;  Service: Urology;  Laterality: N/A;  . CYSTOSCOPY WITH LITHOLAPAXY N/A 05/13/2019   Procedure: CYSTOSCOPY WITH LITHOLAPAXY;  Surgeon: Hollice Espy, MD;  Location: [facility] OR[facility];  Service: Urology;  Laterality: N/A;  . HERNIA REPAIR  2000    History reviewed. No pertinent family history.  Social History   Socioeconomic History  . Marital status: Married    Spouse name: Not on file  . Number of children: Not on file  . Years of education: Not on file  . Highest education level: Not on file  Occupational History  . Not on file  Tobacco Use  . Smoking status: Never Smoker  . Smokeless tobacco: Never Used  Vaping Use  . Vaping Use: Never used  Substance and Sexual Activity  . Alcohol use: No  . Drug use: No  . Sexual activity: Not Currently  Other Topics Concern  . Not on file  Social History Narrative  . Not on file   Social  Determinants of Health   Financial Resource Strain:   . Difficulty of Paying Living Expenses: Not on file  Food Insecurity:   . Worried About Charity fundraiser in the Last Year: Not on file  . Ran Out of Food in the Last Year: Not on file  Transportation Needs:   . Lack of Transportation (Medical): Not on file  . Lack of Transportation (Non-Medical): Not on file  Physical Activity:   . Days of Exercise per Week: Not on file  . Minutes of Exercise per Session: Not on file  Stress:   . Feeling of Stress : Not on file  Social Connections:   . Frequency of Communication with Friends and Family: Not on file  . Frequency of Social Gatherings with Friends and Family: Not on file  . Attends Religious Services: Not on file  . Active Member of Clubs or Organizations: Not on file  . Attends Archivist Meetings: Not on file  . Marital Status: Not on file  Intimate Partner Violence:   . Fear of Current or Ex-Partner: Not on file  . Emotionally Abused: Not on file  . Physically Abused: Not on file  . Sexually Abused: Not on file     Current Outpatient Medications:  .  acetaminophen (TYLENOL) 500  MG tablet, Take 500 mg by mouth daily., Disp: , Rfl:  .  Ascorbic Acid (VITAMIN C PO), Take 1 tablet by mouth daily., Disp: , Rfl:  .  dapagliflozin propanediol (FARXIGA) 10 MG TABS tablet, Take 10 mg by mouth daily., Disp: , Rfl:  .  finasteride (PROSCAR) 5 MG tablet, Take 5 mg by mouth every morning. , Disp: , Rfl:  .  glucose blood test strip, Check blood sugar twice a day, morning before meal and evening, Disp: 100 each, Rfl: 12 .  oxybutynin (DITROPAN) 5 MG tablet, Take 1 tablet (5 mg total) by mouth every 8 (eight) hours as needed for bladder spasms., Disp: 30 tablet, Rfl: 0 .  polyethylene glycol (MIRALAX / GLYCOLAX) 17 g packet, Take 17 g by mouth daily as needed (pain.)., Disp: , Rfl:  .  rosuvastatin (CRESTOR) 20 MG tablet, Take 20 mg by mouth as needed. , Disp: , Rfl:  .   Semaglutide 3 MG TABS, Take 3 mg by mouth daily., Disp: , Rfl:  .  tamsulosin (FLOMAX) 0.4 MG CAPS capsule, Take 0.4 mg by mouth daily after breakfast. , Disp: , Rfl:  .  lisinopril-hydrochlorothiazide (ZESTORETIC) 20-12.5 MG tablet, Take 1 tablet by mouth daily., Disp: 90 tablet, Rfl: 3   No Known Allergies  ROS Review of Systems  Constitutional: Negative.   HENT: Negative.   Eyes: Negative.        + cataracts  Respiratory: Positive for shortness of breath (on exertion).   Cardiovascular: Negative.  Negative for chest pain.  Gastrointestinal: Negative.   Endocrine: Negative.   Genitourinary: Negative.        + nocturia  Musculoskeletal: Positive for arthralgias (knees).  Skin: Negative.   Allergic/Immunologic: Negative.   Neurological: Negative.   Hematological: Negative.   Psychiatric/Behavioral: Negative.   All other systems reviewed and are negative.    OBJECTIVE:    Physical Exam Vitals reviewed.  Constitutional:      Appearance: Normal appearance.  HENT:     Mouth/Throat:     Mouth: Mucous membranes are moist.  Eyes:     Pupils: Pupils are equal, round, and reactive to light.  Neck:     Vascular: No carotid bruit.  Cardiovascular:     Rate and Rhythm: Normal rate and regular rhythm.     Pulses: Normal pulses.     Heart sounds: Murmur heard.  Systolic murmur is present with a grade of 1/6.   Pulmonary:     Effort: Pulmonary effort is normal.     Breath sounds: Normal breath sounds.  Abdominal:     General: Bowel sounds are normal.     Palpations: Abdomen is soft. There is no hepatomegaly, splenomegaly or mass.     Tenderness: There is no abdominal tenderness.     Hernia: No hernia is present.  Musculoskeletal:     Cervical back: Neck supple.     Right lower leg: 1+ Edema present.     Left lower leg: 1+ Edema present.  Skin:    Findings: No rash.  Neurological:     Mental Status: He is alert and oriented to person, place, and time.     Motor: No  weakness.  Psychiatric:        Mood and Affect: Mood normal.        Behavior: Behavior normal.     BP (!) 180/85   Pulse 94   Ht 5' 6" (1.676 m)   Wt 218 lb 8 oz (99.1 kg)     BMI 35.27 kg/m  Wt Readings from Last 3 Encounters:  11/04/19 218 lb 8 oz (99.1 kg)  08/07/19 215 lb (97.5 kg)  05/22/19 223 lb (101.2 kg)    Health Maintenance Due  Topic Date Due  . Hepatitis C Screening  Never done  . TETANUS/TDAP  Never done  . PNA vac Low Risk Adult (1 of 2 - PCV13) Never done  . INFLUENZA VACCINE  Never done    There are no preventive care reminders to display for this patient.  No flowsheet data found. CMP Latest Ref Rng & Units 03/09/2018  Glucose 65 - 99 mg/dL 147(H)  BUN 8 - 27 mg/dL 16  Creatinine 0.76 - 1.27 mg/dL 1.26  Sodium 134 - 144 mmol/L 140  Potassium 3.5 - 5.2 mmol/L 4.4  Chloride 96 - 106 mmol/L 101  CO2 20 - 29 mmol/L 22  Calcium 8.6 - 10.2 mg/dL 10.0    No results found for: TSH No results found for: ALBUMIN, ANIONGAP, EGFR, GFR No results found for: CHOL, HDL, LDLCALC, CHOLHDL No results found for: TRIG Lab Results  Component Value Date   HGBA1C 6.8 (H) 11/04/2019      ASSESSMENT & PLAN:   Problem List Items Addressed This Visit      Cardiovascular and Mediastinum   Essential hypertension - Primary    - Today, the patient's blood pressure is not well managed on lisinopril  hctz was added to previous med. - The patient will change the current treatment regimen.  - I encouraged the patient to eat a low-sodium diet to help control blood pressure. - I encouraged the patient to live an active lifestyle and complete activities that increases heart rate to 85% target heart rate at least 5 times per week for one hour.          Relevant Medications   lisinopril-hydrochlorothiazide (ZESTORETIC) 20-12.5 MG tablet     Other   Obesity (BMI 35.0-39.9 without comorbidity)    - I encouraged the patient to lose weight.  - I educated them on making  healthy dietary choices including eating more fruits and vegetables and less fried foods. - I encouraged the patient to exercise more, and educated on the benefits of exercise including weight loss, diabetes management, and hypertension management.        Prediabetes      - I encouraged the patient to regularly check blood sugar.  - I encouraged the patient to monitor diet. I encouraged the patient to eat low-carb and low-sugar to help prevent blood sugar spikes.  - I encouraged the patient to continue following their prescribed treatment plan for diabetes - I informed the patient to get help if blood sugar drops below 78m/dL, or if suddenly have trouble thinking clearly or breathing.          Relevant Orders   Hemoglobin A1c (Completed)   POCT glucose (manual entry) (Completed)   Annual physical exam    Patient denies any chest pain complains of shortness of breath on exertion.  He has bladder stone which was removed by the urologist and he is doing well.  His nocturia is decreased now he has to get up about 2 times at night.  He was advised to follow-up with a urologist on a regular basis.  [facility].  But needs regular follow-up with his CT scan or MRI.  In addition he need  to have a CT scan to follow-up on the pulmonary nodule.  Importance of weight loss was discussed in detail with him.      Need for COVID-19 vaccine    Patient was advised to take booster shot for his vaccine.  Also need to have a flu shot.      Relevant Orders   Pfizer SARS-COV-2 Vaccine (Completed)      Meds ordered this encounter  Medications  . lisinopril-hydrochlorothiazide (ZESTORETIC) 20-12.5 MG tablet    Sig: Take 1 tablet by mouth daily.    Dispense:  90 tablet    Refill:  3  . glucose blood test strip    Sig: Check blood sugar twice a day, morning before meal and evening    Dispense:  100 each    Refill:   12    Follow-up: No follow-ups on file.     , MD [facility] [address], [place], [place] [address]   By signing my name below, I, Amber Handy, attest that this documentation has been prepared under the direction and in the presence of Dr.   Electronically Signed:  , MD 11/10/19, 3:51 PM  I personally performed the services described in this documentation, which was SCRIBED in my presence. The recorded information has been reviewed and considered accurate. It has been edited as necessary during review.  , MD  

## 2019-11-05 LAB — HEMOGLOBIN A1C
Hgb A1c MFr Bld: 6.8 % of total Hgb — ABNORMAL HIGH (ref <5.7)
Mean Plasma Glucose: 148 (calc)
eAG (mmol/L): 8.2 (calc)

## 2019-11-10 ENCOUNTER — Encounter: Payer: Self-pay | Admitting: Internal Medicine

## 2019-11-10 DIAGNOSIS — Z23 Encounter for immunization: Secondary | ICD-10-CM | POA: Insufficient documentation

## 2019-11-10 NOTE — Assessment & Plan Note (Signed)
Patient was advised to take booster shot for his vaccine.  Also need to have a flu shot.

## 2019-11-10 NOTE — Assessment & Plan Note (Signed)
 -   I encouraged the patient to regularly check blood sugar.  - I encouraged the patient to monitor diet. I encouraged the patient to eat low-carb and low-sugar to help prevent blood sugar spikes.  - I encouraged the patient to continue following their prescribed treatment plan for diabetes - I informed the patient to get help if blood sugar drops below 85mg /dL, or if suddenly have trouble thinking clearly or breathing.

## 2019-11-10 NOTE — Assessment & Plan Note (Signed)
Patient denies any chest pain complains of shortness of breath on exertion.  He has bladder stone which was removed by the urologist and he is doing well.  His nocturia is decreased now he has to get up about 2 times at night.  He was advised to follow-up with a urologist on a regular basis.  He also has been followed up in [facility] for the adrenal tumor which was thought to be benign.  But needs regular follow-up with his CT scan or MRI.  In addition he need to have a CT scan to follow-up on the pulmonary nodule.  Importance of weight loss was discussed in detail with him.

## 2019-11-10 NOTE — Assessment & Plan Note (Signed)
-   I encouraged the patient to lose weight.  - I educated them on making healthy dietary choices including eating more fruits and vegetables and less fried foods. - I encouraged the patient to exercise more, and educated on the benefits of exercise including weight loss, diabetes management, and hypertension management.   

## 2019-11-10 NOTE — Assessment & Plan Note (Signed)
-   Today, the patient's blood pressure is not well managed on lisinopril  hctz was added to previous med. - The patient will change the current treatment regimen.  - I encouraged the patient to eat a low-sodium diet to help control blood pressure. - I encouraged the patient to live an active lifestyle and complete activities that increases heart rate to 85% target heart rate at least 5 times per week for one hour.

## 2020-01-09 ENCOUNTER — Other Ambulatory Visit: Payer: Self-pay

## 2020-01-09 MED ORDER — GLUCOSE BLOOD VI STRP: ORAL_STRIP | 12 refills | Status: DC

## 2020-08-04 ENCOUNTER — Other Ambulatory Visit: Payer: Self-pay

## 2020-08-06 ENCOUNTER — Ambulatory Visit: Payer: Medicare Other | Admitting: Urology

## 2020-08-07 ENCOUNTER — Other Ambulatory Visit: Payer: Self-pay

## 2020-08-07 ENCOUNTER — Other Ambulatory Visit: Payer: Medicare Other

## 2020-08-07 DIAGNOSIS — N138 Other obstructive and reflux uropathy: Secondary | ICD-10-CM | POA: Diagnosis not present

## 2020-08-07 DIAGNOSIS — N401 Enlarged prostate with lower urinary tract symptoms: Secondary | ICD-10-CM | POA: Diagnosis not present

## 2020-08-08 LAB — PSA: Prostate Specific Ag, Serum: 2 ng/mL (ref 0.0–4.0)

## 2020-08-19 ENCOUNTER — Ambulatory Visit: Payer: Medicare Other | Admitting: Urology

## 2020-08-21 ENCOUNTER — Encounter: Payer: Self-pay | Admitting: Urology

## 2020-11-17 ENCOUNTER — Ambulatory Visit (INDEPENDENT_AMBULATORY_CARE_PROVIDER_SITE_OTHER): Payer: Medicare Other | Admitting: Internal Medicine

## 2020-11-17 ENCOUNTER — Other Ambulatory Visit: Payer: Self-pay

## 2020-11-17 ENCOUNTER — Encounter: Payer: Self-pay | Admitting: Internal Medicine

## 2020-11-17 DIAGNOSIS — E669 Obesity, unspecified: Secondary | ICD-10-CM

## 2020-11-17 DIAGNOSIS — R42 Dizziness and giddiness: Secondary | ICD-10-CM

## 2020-11-17 DIAGNOSIS — R7303 Prediabetes: Secondary | ICD-10-CM

## 2020-11-17 DIAGNOSIS — I10 Essential (primary) hypertension: Secondary | ICD-10-CM

## 2020-11-17 DIAGNOSIS — Z0000 Encounter for general adult medical examination without abnormal findings: Secondary | ICD-10-CM

## 2020-11-17 MED ORDER — METFORMIN HCL 500 MG PO TABS: 500.0000 mg | ORAL_TABLET | Freq: Two times a day (BID) | ORAL | 3 refills | Status: DC

## 2020-11-17 MED ORDER — ROSUVASTATIN CALCIUM 20 MG PO TABS: 20.0000 mg | ORAL_TABLET | ORAL | 4 refills | Status: AC | PRN

## 2020-11-17 MED ORDER — BLOOD GLUCOSE MONITOR KIT: PACK | 0 refills | Status: DC

## 2020-11-17 MED ORDER — MECLIZINE HCL 12.5 MG PO TABS: 12.5000 mg | ORAL_TABLET | Freq: Two times a day (BID) | ORAL | 1 refills | Status: AC | PRN

## 2020-11-17 NOTE — Assessment & Plan Note (Signed)
Patient was advised to lose weight We will find out the cause of recurrent dizziness which may be due to inner ear vertigo.  I will use Antivert 12.5 mg twice a day, also take baby aspirin 81 mg daily, will check CBC and metabolic panel, electrocardiogram does not show any acute changes,

## 2020-11-17 NOTE — Progress Notes (Signed)
Established Patient Office Visit  Subjective:  Patient ID: Albert Gray, male    DOB: 02-18-1945  Age: 75 y.o. MRN: [id number]  CC: No chief complaint on file.   HPI  Hagop A Blouch presents for dissiness, there is no history of fever chills palpitation weakness of numbness of the hand or leg, denies chest pain shortness of breath  Past Medical History:  Diagnosis Date   Arthritis    Diabetes mellitus without complication ([place])    Hyperlipidemia    Hypertension     Past Surgical History:  Procedure Laterality Date   COLONOSCOPY  2002   Dr Jamal Collin x 2   [place] N/A 05/13/2019   Procedure: CYSTOSCOPY WITH FULGERATION;  Surgeon: Hollice Espy, MD;  Location: [facility] ORS;  Service: Urology;  Laterality: N/A;   CYSTOSCOPY WITH LITHOLAPAXY N/A 05/13/2019   Procedure: CYSTOSCOPY WITH LITHOLAPAXY;  Surgeon: Hollice Espy, MD;  Location: [facility] ORS;  Service: Urology;  Laterality: N/A;   HERNIA REPAIR  2000    No family history on file.  Social History   Socioeconomic History   Marital status: Married    Spouse name: Not on file   Number of children: Not on file   Years of education: Not on file   Highest education level: Not on file  Occupational History   Not on file  Tobacco Use   Smoking status: Never   Smokeless tobacco: Never  Vaping Use   Vaping Use: Never used  Substance and Sexual Activity   Alcohol use: No   Drug use: No   Sexual activity: Not Currently  Other Topics Concern   Not on file  Social History Narrative   Not on file   Social Determinants of Health   Financial Resource Strain: Not on file  Food Insecurity: Not on file  Transportation Needs: Not on file  Physical Activity: Not on file  Stress: Not on file  Social Connections: Not on file  Intimate Partner Violence: Not on file     Current Outpatient Medications:    blood glucose meter kit and supplies KIT, Dispense based on patient and insurance preference. Use up to  four times daily as directed., Disp: 1 each, Rfl: 0   meclizine (ANTIVERT) 12.5 MG tablet, Take 1 tablet (12.5 mg total) by mouth 2 (two) times daily as needed for dizziness., Disp: 30 tablet, Rfl: 1   metFORMIN (GLUCOPHAGE) 500 MG tablet, Take 1 tablet (500 mg total) by mouth 2 (two) times daily with a meal., Disp: 60 tablet, Rfl: 3   acetaminophen (TYLENOL) 500 MG tablet, Take 500 mg by mouth daily., Disp: , Rfl:    Ascorbic Acid (VITAMIN C PO), Take 1 tablet by mouth daily., Disp: , Rfl:    dapagliflozin propanediol (FARXIGA) 10 MG TABS tablet, Take 10 mg by mouth daily., Disp: , Rfl:    finasteride (PROSCAR) 5 MG tablet, Take 5 mg by mouth every morning. , Disp: , Rfl:    glucose blood test strip, Check blood sugar twice a day, morning before meal and evening, Disp: 100 each, Rfl: 12   lisinopril-hydrochlorothiazide (ZESTORETIC) 20-12.5 MG tablet, Take 1 tablet by mouth daily., Disp: 90 tablet, Rfl: 3   oxybutynin (DITROPAN) 5 MG tablet, Take 1 tablet (5 mg total) by mouth every 8 (eight) hours as needed for bladder spasms., Disp: 30 tablet, Rfl: 0   polyethylene glycol (MIRALAX / GLYCOLAX) 17 g packet, Take 17 g by mouth daily as needed (pain.)., Disp: ,  Rfl:    rosuvastatin (CRESTOR) 20 MG tablet, Take 1 tablet (20 mg total) by mouth as needed., Disp: 30 tablet, Rfl: 4   Semaglutide 3 MG TABS, Take 3 mg by mouth daily., Disp: , Rfl:    tamsulosin (FLOMAX) 0.4 MG CAPS capsule, Take 0.4 mg by mouth daily after breakfast. , Disp: , Rfl:    No Known Allergies  ROS Review of Systems  Constitutional:  Negative for appetite change, chills, diaphoresis and fatigue.  HENT:  Negative for congestion, ear discharge, hearing loss and postnasal drip.   Eyes:  Negative for pain.  Respiratory:  Negative for cough, chest tightness and shortness of breath.   Cardiovascular:  Negative for leg swelling.  Gastrointestinal:  Negative for abdominal distention and abdominal pain.  Genitourinary:  Positive for  frequency. Negative for hematuria.  Neurological:  Positive for dizziness. Negative for seizures, syncope, facial asymmetry, speech difficulty and headaches.  Psychiatric/Behavioral:  Negative for decreased concentration and dysphoric mood.      Objective:    Physical Exam Vitals reviewed.  Constitutional:      Appearance: Normal appearance. He is obese. He is not ill-appearing.  HENT:     Head: Normocephalic.     Mouth/Throat:     Mouth: Mucous membranes are moist.  Eyes:     General: No scleral icterus.    Extraocular Movements: Extraocular movements intact.     Conjunctiva/sclera: Conjunctivae normal.     Pupils: Pupils are equal, round, and reactive to light.  Neck:     Vascular: No carotid bruit.  Cardiovascular:     Rate and Rhythm: Normal rate and regular rhythm.     Heart sounds: No murmur heard. Pulmonary:     Effort: Pulmonary effort is normal. No respiratory distress.  Abdominal:     General: Bowel sounds are normal.  Musculoskeletal:     Cervical back: Neck supple. No tenderness.  Lymphadenopathy:     Cervical: No cervical adenopathy.  Skin:    General: Skin is warm.  Neurological:     General: No focal deficit present.     Mental Status: He is alert. Mental status is at baseline.     Cranial Nerves: No cranial nerve deficit.  Psychiatric:        Mood and Affect: Mood normal.    There were no vitals taken for this visit. Wt Readings from Last 3 Encounters:  11/04/19 218 lb 8 oz (99.1 kg)  08/07/19 215 lb (97.5 kg)  05/22/19 223 lb (101.2 kg)     Health Maintenance Due  Topic Date Due   Hepatitis C Screening  Never done   TETANUS/TDAP  Never done   Zoster Vaccines- Shingrix (1 of 2) Never done   Pneumonia Vaccine 44+ Years old (2 - PPSV23 if available, else PCV20) 09/01/2015   COVID-19 Vaccine (4 - Booster for Pfizer series) 12/30/2019   INFLUENZA VACCINE  Never done    There are no preventive care reminders to display for this patient.  No  results found for: TSH No results found for: WBC, HGB, HCT, MCV, PLT Lab Results  Component Value Date   NA 140 03/09/2018   K 4.4 03/09/2018   CO2 22 03/09/2018   GLUCOSE 147 (H) 03/09/2018   BUN 16 03/09/2018   CREATININE 1.26 03/09/2018   CALCIUM 10.0 03/09/2018   No results found for: CHOL No results found for: HDL No results found for: LDLCALC No results found for: TRIG No results found for: CHOLHDL Lab  Results  Component Value Date   HGBA1C 6.8 (H) 11/04/2019      Assessment & Plan:   Problem List Items Addressed This Visit       Cardiovascular and Mediastinum   Essential hypertension   Relevant Medications   rosuvastatin (CRESTOR) 20 MG tablet   Other Relevant Orders   TSH     Other   Obesity (BMI 35.0-39.9 without comorbidity)   Relevant Medications   metFORMIN (GLUCOPHAGE) 500 MG tablet   Other Relevant Orders   TSH   Lipid panel   Other Visit Diagnoses     Dizziness    -  Primary   Relevant Orders   EKG 12-Lead   24 hour holter monitor   CBC with Differential/Platelet   COMPLETE METABOLIC PANEL WITH GFR   Lipid panel       Meds ordered this encounter  Medications   rosuvastatin (CRESTOR) 20 MG tablet    Sig: Take 1 tablet (20 mg total) by mouth as needed.    Dispense:  30 tablet    Refill:  4   metFORMIN (GLUCOPHAGE) 500 MG tablet    Sig: Take 1 tablet (500 mg total) by mouth 2 (two) times daily with a meal.    Dispense:  60 tablet    Refill:  3   meclizine (ANTIVERT) 12.5 MG tablet    Sig: Take 1 tablet (12.5 mg total) by mouth 2 (two) times daily as needed for dizziness.    Dispense:  30 tablet    Refill:  1   blood glucose meter kit and supplies KIT    Sig: Dispense based on patient and insurance preference. Use up to four times daily as directed.    Dispense:  1 each    Refill:  0    Order Specific Question:   Number of strips    Answer:   100    Order Specific Question:   Number of lancets    Answer:   100  Report of the  electrocardiogram.  Normal sinus rhythm, no acute changes were noted  Follow-up: No follow-ups on file.    Cletis Athens, MD

## 2020-11-17 NOTE — Assessment & Plan Note (Signed)
Behavioral modification strategies: increasing lean protein intake, decreasing simple carbohydrates, increasing vegetables, increasing water intake, decreasing eating out, no skipping meals, meal planning and cooking strategies, keeping healthy foods in the home and planning for success. 

## 2020-11-17 NOTE — Assessment & Plan Note (Signed)
Blood pressure was found to be elevated today because of the stress and vertigo.  Electrocardiogram was normal without any acute changes.  Neurological examination is nonfocal.  Both ear canals are patent without any wax.  Otitis externa is noted in the both  ears

## 2020-11-17 NOTE — Assessment & Plan Note (Signed)
Continue metformin 500 mg p.o. twice a day.

## 2020-11-18 ENCOUNTER — Ambulatory Visit (INDEPENDENT_AMBULATORY_CARE_PROVIDER_SITE_OTHER): Payer: Medicare Other | Admitting: Internal Medicine

## 2020-11-18 ENCOUNTER — Encounter: Payer: Self-pay | Admitting: Internal Medicine

## 2020-11-18 VITALS — BP 140/80 | HR 71 | Ht 66.0 in | Wt 218.0 lb

## 2020-11-18 DIAGNOSIS — E669 Obesity, unspecified: Secondary | ICD-10-CM | POA: Diagnosis not present

## 2020-11-18 DIAGNOSIS — I10 Essential (primary) hypertension: Secondary | ICD-10-CM | POA: Diagnosis not present

## 2020-11-18 DIAGNOSIS — R7303 Prediabetes: Secondary | ICD-10-CM | POA: Diagnosis not present

## 2020-11-18 DIAGNOSIS — R42 Dizziness and giddiness: Secondary | ICD-10-CM | POA: Diagnosis not present

## 2020-11-18 DIAGNOSIS — Z0000 Encounter for general adult medical examination without abnormal findings: Secondary | ICD-10-CM

## 2020-11-18 LAB — CBC WITH DIFFERENTIAL/PLATELET
Absolute Monocytes: 382 cells/uL (ref 200–950)
Basophils Absolute: 29 cells/uL (ref 0–200)
Basophils Relative: 0.4 %
Eosinophils Absolute: 43 cells/uL (ref 15–500)
Eosinophils Relative: 0.6 %
HCT: 45.6 % (ref 38.5–50.0)
Hemoglobin: 16.1 g/dL (ref 13.2–17.1)
Lymphs Abs: 1433 cells/uL (ref 850–3900)
MCH: 32.8 pg (ref 27.0–33.0)
MCHC: 35.3 g/dL (ref 32.0–36.0)
MCV: 92.9 fL (ref 80.0–100.0)
MPV: 13.1 fL — ABNORMAL HIGH (ref 7.5–12.5)
Monocytes Relative: 5.3 %
Neutro Abs: 5314 cells/uL (ref 1500–7800)
Neutrophils Relative %: 73.8 %
Platelets: 186 10*3/uL (ref 140–400)
RBC: 4.91 10*6/uL (ref 4.20–5.80)
RDW: 12.5 % (ref 11.0–15.0)
Total Lymphocyte: 19.9 %
WBC: 7.2 10*3/uL (ref 3.8–10.8)

## 2020-11-18 LAB — LIPID PANEL
Cholesterol: 177 mg/dL (ref <200)
HDL: 37 mg/dL — ABNORMAL LOW (ref >=40)
LDL Cholesterol (Calc): 115 mg/dL (calc) — ABNORMAL HIGH
Non-HDL Cholesterol (Calc): 140 mg/dL (calc) — ABNORMAL HIGH (ref <130)
Total CHOL/HDL Ratio: 4.8 (calc) (ref <5.0)
Triglycerides: 135 mg/dL (ref <150)

## 2020-11-18 LAB — COMPLETE METABOLIC PANEL WITH GFR
AG Ratio: 1.2 (calc) (ref 1.0–2.5)
ALT: 16 U/L (ref 9–46)
AST: 16 U/L (ref 10–35)
Albumin: 3.9 g/dL (ref 3.6–5.1)
Alkaline phosphatase (APISO): 68 U/L (ref 35–144)
BUN: 16 mg/dL (ref 7–25)
CO2: 24 mmol/L (ref 20–32)
Calcium: 9.3 mg/dL (ref 8.6–10.3)
Chloride: 102 mmol/L (ref 98–110)
Creat: 0.98 mg/dL (ref 0.70–1.28)
Globulin: 3.3 g/dL (calc) (ref 1.9–3.7)
Glucose, Bld: 278 mg/dL — ABNORMAL HIGH (ref 65–99)
Potassium: 4.5 mmol/L (ref 3.5–5.3)
Sodium: 136 mmol/L (ref 135–146)
Total Bilirubin: 0.6 mg/dL (ref 0.2–1.2)
Total Protein: 7.2 g/dL (ref 6.1–8.1)
eGFR: 81 mL/min/{1.73_m2} (ref >=60)

## 2020-11-18 LAB — TSH: TSH: 1.82 mIU/L (ref 0.40–4.50)

## 2020-11-18 MED ORDER — BLOOD GLUCOSE MONITOR KIT
1.0000 | PACK | Freq: Two times a day (BID) | 0 refills | Status: DC
Start: 2020-11-18 — End: 2021-01-21

## 2020-11-18 NOTE — Addendum Note (Signed)
Addended by: Jobie Quaker on: 11/18/2020 04:23 PM   Modules accepted: Orders

## 2020-11-20 ENCOUNTER — Encounter: Payer: Self-pay | Admitting: Internal Medicine

## 2020-11-20 NOTE — Assessment & Plan Note (Signed)
We will get a CT of the head and neck with contrast to evaluate the vertigo, electrocardiogram did not show any arrhythmia or acute changes.  Patient does not smoke does not drink blood sugar need to be evaluated further while on metformin

## 2020-11-20 NOTE — Assessment & Plan Note (Signed)
Behavioral modification strategies: increasing lean protein intake, decreasing simple carbohydrates, increasing vegetables, increasing water intake, decreasing eating out, no skipping meals, meal planning and cooking strategies, keeping healthy foods in the home and planning for success. 

## 2020-11-20 NOTE — Progress Notes (Signed)
Established Patient Office Visit  Subjective:  Patient ID: Albert Gray, male    DOB: 02-Nov-1945  Age: 75 y.o. MRN: [id number]  CC:  Chief Complaint  Patient presents with   Follow-up    Dizziness This is a recurrent problem. The current episode started in the past 7 days. The problem occurs intermittently. Associated symptoms include joint swelling, myalgias, vertigo and vomiting. Pertinent negatives include no abdominal pain, anorexia, arthralgias, change in bowel habit, chest pain, chills, congestion, coughing, diaphoresis, headaches, neck pain, sore throat, swollen glands or visual change. He has tried rest for the symptoms. The treatment provided mild relief.   Albert Gray presents for vertigo Past Medical History:  Diagnosis Date   Arthritis    Diabetes mellitus without complication ([place])    Hyperlipidemia    Hypertension     Past Surgical History:  Procedure Laterality Date   COLONOSCOPY  2002   Dr Jamal Collin x 2   [place] N/A 05/13/2019   Procedure: CYSTOSCOPY WITH FULGERATION;  Surgeon: Hollice Espy, MD;  Location: [facility] ORS;  Service: Urology;  Laterality: N/A;   CYSTOSCOPY WITH LITHOLAPAXY N/A 05/13/2019   Procedure: CYSTOSCOPY WITH LITHOLAPAXY;  Surgeon: Hollice Espy, MD;  Location: [facility] ORS;  Service: Urology;  Laterality: N/A;   HERNIA REPAIR  2000    History reviewed. No pertinent family history.  Social History   Socioeconomic History   Marital status: Married    Spouse name: Not on file   Number of children: Not on file   Years of education: Not on file   Highest education level: Not on file  Occupational History   Not on file  Tobacco Use   Smoking status: Never   Smokeless tobacco: Never  Vaping Use   Vaping Use: Never used  Substance and Sexual Activity   Alcohol use: No   Drug use: No   Sexual activity: Not Currently  Other Topics Concern   Not on file  Social History Narrative   Not on file   Social  Determinants of Health   Financial Resource Strain: Not on file  Food Insecurity: Not on file  Transportation Needs: Not on file  Physical Activity: Not on file  Stress: Not on file  Social Connections: Not on file  Intimate Partner Violence: Not on file     Current Outpatient Medications:    acetaminophen (TYLENOL) 500 MG tablet, Take 500 mg by mouth daily., Disp: , Rfl:    Ascorbic Acid (VITAMIN C PO), Take 1 tablet by mouth daily., Disp: , Rfl:    blood glucose meter kit and supplies KIT, 1 each by Other route 2 (two) times daily. Dispense based on patient and insurance preference. Use up to four times daily as directed., Disp: 1 each, Rfl: 0   dapagliflozin propanediol (FARXIGA) 10 MG TABS tablet, Take 10 mg by mouth daily., Disp: , Rfl:    finasteride (PROSCAR) 5 MG tablet, Take 5 mg by mouth every morning. , Disp: , Rfl:    glucose blood test strip, Check blood sugar twice a day, morning before meal and evening, Disp: 100 each, Rfl: 12   lisinopril-hydrochlorothiazide (ZESTORETIC) 20-12.5 MG tablet, Take 1 tablet by mouth daily., Disp: 90 tablet, Rfl: 3   meclizine (ANTIVERT) 12.5 MG tablet, Take 1 tablet (12.5 mg total) by mouth 2 (two) times daily as needed for dizziness., Disp: 30 tablet, Rfl: 1   metFORMIN (GLUCOPHAGE) 500 MG tablet, Take 1 tablet (500 mg total) by mouth 2 (  two) times daily with a meal., Disp: 60 tablet, Rfl: 3   oxybutynin (DITROPAN) 5 MG tablet, Take 1 tablet (5 mg total) by mouth every 8 (eight) hours as needed for bladder spasms., Disp: 30 tablet, Rfl: 0   polyethylene glycol (MIRALAX / GLYCOLAX) 17 g packet, Take 17 g by mouth daily as needed (pain.)., Disp: , Rfl:    rosuvastatin (CRESTOR) 20 MG tablet, Take 1 tablet (20 mg total) by mouth as needed., Disp: 30 tablet, Rfl: 4   Semaglutide 3 MG TABS, Take 3 mg by mouth daily., Disp: , Rfl:    tamsulosin (FLOMAX) 0.4 MG CAPS capsule, Take 0.4 mg by mouth daily after breakfast. , Disp: , Rfl:    No Known  Allergies  ROS Review of Systems  Constitutional:  Negative for chills and diaphoresis.  HENT:  Negative for congestion and sore throat.   Respiratory:  Negative for cough.   Cardiovascular:  Negative for chest pain.  Gastrointestinal:  Positive for vomiting. Negative for abdominal pain, anorexia and change in bowel habit.  Musculoskeletal:  Positive for joint swelling and myalgias. Negative for arthralgias and neck pain.  Neurological:  Positive for dizziness and vertigo. Negative for headaches.     Objective:    Physical Exam Vitals reviewed.  Constitutional:      Appearance: Normal appearance.  HENT:     Mouth/Throat:     Mouth: Mucous membranes are moist.  Eyes:     General: No visual field deficit.    Pupils: Pupils are equal, round, and reactive to light.  Neck:     Vascular: No carotid bruit.  Cardiovascular:     Rate and Rhythm: Normal rate and regular rhythm.     Pulses: Normal pulses.     Heart sounds: Normal heart sounds.  Pulmonary:     Effort: Pulmonary effort is normal.     Breath sounds: Normal breath sounds.  Abdominal:     General: Bowel sounds are normal.     Palpations: Abdomen is soft. There is no hepatomegaly, splenomegaly or mass.     Tenderness: There is no abdominal tenderness.     Hernia: No hernia is present.  Musculoskeletal:     Cervical back: Neck supple.     Right lower leg: No edema.     Left lower leg: No edema.  Skin:    Findings: No rash.  Neurological:     Mental Status: He is alert and oriented to person, place, and time. Mental status is at baseline.     Cranial Nerves: Cranial nerves 2-12 are intact. No cranial nerve deficit, dysarthria or facial asymmetry.     Motor: No weakness, tremor, abnormal muscle tone or seizure activity.     Gait: Gait abnormal.     Comments: Sensation and vibration decreased in both legs patient is wheelchair-bound on, patient has arthritis of both knee joints ear examination does not reveal any wax,  deep tendon reflexes are decreased vibration sense is decreased in both legs sensation is decreased to touch  Psychiatric:        Mood and Affect: Mood normal.        Behavior: Behavior normal.    BP 140/80   Pulse 71   Ht 5' 6" (1.676 m)   Wt 218 lb (98.9 kg)   BMI 35.19 kg/m  Wt Readings from Last 3 Encounters:  11/18/20 218 lb (98.9 kg)  11/04/19 218 lb 8 oz (99.1 kg)  08/07/19 215 lb (97.5 kg)       Health Maintenance Due  Topic Date Due   Hepatitis C Screening  Never done   TETANUS/TDAP  Never done   Zoster Vaccines- Shingrix (1 of 2) Never done   Pneumonia Vaccine 65+ Years old (2 - PPSV23 if available, else PCV20) 09/01/2015   COVID-19 Vaccine (4 - Booster for Pfizer series) 12/30/2019   INFLUENZA VACCINE  Never done    There are no preventive care reminders to display for this patient.  Lab Results  Component Value Date   TSH 1.82 11/17/2020   Lab Results  Component Value Date   WBC 7.2 11/17/2020   HGB 16.1 11/17/2020   HCT 45.6 11/17/2020   MCV 92.9 11/17/2020   PLT 186 11/17/2020   Lab Results  Component Value Date   NA 136 11/17/2020   K 4.5 11/17/2020   CO2 24 11/17/2020   GLUCOSE 278 (H) 11/17/2020   BUN 16 11/17/2020   CREATININE 0.98 11/17/2020   BILITOT 0.6 11/17/2020   AST 16 11/17/2020   ALT 16 11/17/2020   PROT 7.2 11/17/2020   CALCIUM 9.3 11/17/2020   EGFR 81 11/17/2020   Lab Results  Component Value Date   CHOL 177 11/17/2020   Lab Results  Component Value Date   HDL 37 (L) 11/17/2020   Lab Results  Component Value Date   LDLCALC 115 (H) 11/17/2020   Lab Results  Component Value Date   TRIG 135 11/17/2020   Lab Results  Component Value Date   CHOLHDL 4.8 11/17/2020   Lab Results  Component Value Date   HGBA1C 6.8 (H) 11/04/2019      Assessment & Plan:   Problem List Items Addressed This Visit       Cardiovascular and Mediastinum   Essential hypertension    The following hypertensive lifestyle modification  were recommended and discussed:  1. Limiting alcohol intake to less than 1 oz/day of ethanol:(24 oz of beer or 8 oz of wine or 2 oz of 100-proof whiskey). 2. Take baby ASA 81 mg daily. 3. Importance of regular aerobic exercise and losing weight. 4. Reduce dietary saturated fat and cholesterol intake for overall cardiovascular health. 5. Maintaining adequate dietary potassium, calcium, and magnesium intake. 6. Regular monitoring of the blood pressure. 7. Reduce sodium intake to less than 100 mmol/day (less than 2.3 gm of sodium or less than 6 gm of sodium choride)         Other   Obesity (BMI 35.0-39.9 without comorbidity)    Behavioral modification strategies: increasing lean protein intake, decreasing simple carbohydrates, increasing vegetables, increasing water intake, decreasing eating out, no skipping meals, meal planning and cooking strategies, keeping healthy foods in the home and planning for success.      Prediabetes    Patient was started on metformin 500 mg p.o. twice a day      Health maintenance examination    We will get a CT of the head and neck with contrast to evaluate the vertigo, electrocardiogram did not show any arrhythmia or acute changes.  Patient does not smoke does not drink blood sugar need to be evaluated further while on metformin      Other Visit Diagnoses     Dizziness    -  Primary   Relevant Orders   CT HEAD W & WO CONTRAST (5MM)   Ambulatory referral to Neurology   US Carotid Bilateral       No orders of the defined types were placed in this encounter.   Follow-up:   No follow-ups on file.    Cletis Athens, MD

## 2020-11-20 NOTE — Assessment & Plan Note (Signed)
Patient was started on metformin 500 mg p.o. twice a day

## 2020-11-20 NOTE — Assessment & Plan Note (Signed)

## 2020-11-23 ENCOUNTER — Ambulatory Visit: Payer: Medicare Other | Admitting: Internal Medicine

## 2020-12-03 ENCOUNTER — Ambulatory Visit
Admission: RE | Admit: 2020-12-03 | Discharge: 2020-12-03 | Disposition: A | Discharge status: Home / Self Care | Payer: Medicare Other | Source: Ambulatory Visit | Attending: Internal Medicine | Admitting: Internal Medicine

## 2020-12-03 ENCOUNTER — Other Ambulatory Visit: Payer: Self-pay | Admitting: Internal Medicine

## 2020-12-03 ENCOUNTER — Other Ambulatory Visit: Payer: Self-pay

## 2020-12-03 DIAGNOSIS — R55 Syncope and collapse: Secondary | ICD-10-CM | POA: Diagnosis not present

## 2020-12-03 DIAGNOSIS — R42 Dizziness and giddiness: Secondary | ICD-10-CM | POA: Diagnosis not present

## 2020-12-03 DIAGNOSIS — E119 Type 2 diabetes mellitus without complications: Secondary | ICD-10-CM | POA: Diagnosis not present

## 2020-12-03 DIAGNOSIS — E782 Mixed hyperlipidemia: Secondary | ICD-10-CM | POA: Diagnosis not present

## 2020-12-09 ENCOUNTER — Other Ambulatory Visit: Payer: Self-pay | Admitting: Internal Medicine

## 2021-01-21 ENCOUNTER — Ambulatory Visit (INDEPENDENT_AMBULATORY_CARE_PROVIDER_SITE_OTHER): Payer: Medicare Other

## 2021-01-21 DIAGNOSIS — R42 Dizziness and giddiness: Secondary | ICD-10-CM | POA: Diagnosis not present

## 2021-01-21 DIAGNOSIS — Z0000 Encounter for general adult medical examination without abnormal findings: Secondary | ICD-10-CM | POA: Diagnosis not present

## 2021-01-21 MED ORDER — GLUCOSE BLOOD VI STRP: 1.0000 | ORAL_STRIP | Freq: Four times a day (QID) | 3 refills | Status: AC

## 2021-01-21 MED ORDER — BLOOD GLUCOSE MONITOR KIT: 1.0000 | PACK | Freq: Four times a day (QID) | 3 refills | Status: DC

## 2021-01-21 NOTE — Progress Notes (Signed)
Subjective:   Albert Gray is a 75 y.o. male who presents for Medicare Annual/Subsequent preventive examination. .I discussed the limitations of evaluation and management by telemedicine and the availability of in person appointments. The patient expressed understanding and agreed to proceed.   Visit performed by audio   Patient location: Home  Provider location: Home  Review of Systems    N/A       Objective:    There were no vitals filed for this visit. There is no height or weight on file to calculate BMI.  Advanced Directives 01/21/2021 05/08/2019  Does Patient Have a Medical Advance Directive? No No    Current Medications (verified) Outpatient Encounter Medications as of 01/21/2021  Medication Sig   acetaminophen (TYLENOL) 500 MG tablet Take 500 mg by mouth daily.   Ascorbic Acid (VITAMIN C PO) Take 1 tablet by mouth daily.   blood glucose meter kit and supplies KIT 1 each by Other route 2 (two) times daily. Dispense based on patient and insurance preference. Use up to four times daily as directed. (Patient taking differently: 1 each by Other route 4 (four) times daily. Dispense based on patient and insurance preference. Use up to four times daily as directed.)   dapagliflozin propanediol (FARXIGA) 10 MG TABS tablet Take 10 mg by mouth daily.   finasteride (PROSCAR) 5 MG tablet Take 5 mg by mouth every morning.    glucose blood test strip Check blood sugar twice a day, morning before meal and evening (Patient taking differently: 1 each 4 (four) times daily. Check blood sugar twice a day, morning before meal and evening)   lisinopril-hydrochlorothiazide (ZESTORETIC) 20-12.5 MG tablet Take 1 tablet by mouth daily.   meclizine (ANTIVERT) 12.5 MG tablet Take 1 tablet (12.5 mg total) by mouth 2 (two) times daily as needed for dizziness.   metFORMIN (GLUCOPHAGE) 500 MG tablet Take 1 tablet (500 mg total) by mouth 2 (two) times daily with a meal.   oxybutynin (DITROPAN) 5 MG  tablet Take 1 tablet (5 mg total) by mouth every 8 (eight) hours as needed for bladder spasms.   polyethylene glycol (MIRALAX / GLYCOLAX) 17 g packet Take 17 g by mouth daily as needed (pain.).   rosuvastatin (CRESTOR) 20 MG tablet Take 1 tablet (20 mg total) by mouth as needed.   Semaglutide 3 MG TABS Take 3 mg by mouth daily.   tamsulosin (FLOMAX) 0.4 MG CAPS capsule Take 0.4 mg by mouth daily after breakfast.    No facility-administered encounter medications on file as of 01/21/2021.    Allergies (verified) Patient has no known allergies.   History: Past Medical History:  Diagnosis Date   Arthritis    Diabetes mellitus without complication ([place])    Hyperlipidemia    Hypertension    Past Surgical History:  Procedure Laterality Date   COLONOSCOPY  2002   Dr Jamal Collin x 2   [place] N/A 05/13/2019   Procedure: CYSTOSCOPY WITH FULGERATION;  Surgeon: Hollice Espy, MD;  Location: [facility] ORS;  Service: Urology;  Laterality: N/A;   CYSTOSCOPY WITH LITHOLAPAXY N/A 05/13/2019   Procedure: CYSTOSCOPY WITH LITHOLAPAXY;  Surgeon: Hollice Espy, MD;  Location: [facility] OR[facility];  Service: Urology;  Laterality: N/A;   HERNIA REPAIR  2000   History reviewed. No pertinent family history. Social History   Socioeconomic History   Marital status: Married    Spouse name: Not on file   Number of children: Not on file   Years of education: Not  on file   Highest education level: Not on file  Occupational History   Not on file  Tobacco Use   Smoking status: Never   Smokeless tobacco: Never  Vaping Use   Vaping Use: Never used  Substance and Sexual Activity   Alcohol use: No   Drug use: No   Sexual activity: Not Currently  Other Topics Concern   Not on file  Social History Narrative   Not on file   Social Determinants of Health   Financial Resource Strain: Low Risk    Difficulty of Paying Living Expenses: Not hard at all  Food Insecurity: No Food Insecurity   Worried  About Charity fundraiser in the Last Year: Never true   [place] in the Last Year: Never true  Transportation Needs: No Transportation Needs   Lack of Transportation (Medical): No   Lack of Transportation (Non-Medical): No  Physical Activity: Insufficiently Active   Days of Exercise per Week: 5 days   Minutes of Exercise per Session: 10 min  Stress: No Stress Concern Present   Feeling of Stress : Not at all  Social Connections: Socially Integrated   Frequency of Communication with Friends and Family: More than three times a week   Frequency of Social Gatherings with Friends and Family: Three times a week   Attends Religious Services: More than 4 times per year   Active Member of Clubs or Organizations: Yes   Attends Music therapist: More than 4 times per year   Marital Status: Married    Tobacco Counseling Counseling given: Not Answered   Clinical Intake:  Pre-visit preparation completed: Yes  Pain : No/denies pain     Diabetes: Yes  How often do you need to have someone help you when you read instructions, pamphlets, or other written materials from your doctor or pharmacy?: 1 - Never  Diabetic? No pt is prediabetes.  Interpreter Needed?: No (Pt speaks and understand english, but english is not the pt's perferrred language)  Information entered by :: An[place] CMA   Activities of Daily Living In your present state of health, do you have any difficulty performing the following activities: 01/21/2021  Hearing? N  Vision? N  Difficulty concentrating or making decisions? N  Walking or climbing stairs? N  Dressing or bathing? N  Doing errands, shopping? N  Preparing Food and eating ? N  Using the Toilet? N  In the past six months, have you accidently leaked urine? N  Do you have problems with loss of bowel control? N  Managing your Medications? N  Managing your Finances? N  Housekeeping or managing your Housekeeping? N  Some recent data  might be hidden    Patient Care Team: Cletis Athens, MD as PCP - General (Internal Medicine) Christene Lye, MD (General Surgery) Cletis Athens, MD (Internal Medicine)  Indicate any recent Medical Services you may have received from other than [facility] providers in the past year (date may be approximate).     Assessment:   This is a routine wellness examination for Albert Gray.  Hearing/Vision screen No results found.  Dietary issues and exercise activities discussed:     Goals Addressed   None    Depression Screen PHQ 2/9 Scores 01/21/2021 11/04/2019  PHQ - 2 Score 0 0    Fall Risk Fall Risk  01/21/2021 11/04/2019  Falls in the past year? 0 0  Number falls in past yr: 0 0  Injury with Fall? 0  0  Risk for fall due to : No Fall Risks -  Follow up Falls evaluation completed -    FALL RISK PREVENTION PERTAINING TO THE HOME:  Any stairs in or around the home? Yes  If so, are there any without handrails? No  Home free of loose throw rugs in walkways, pet beds, electrical cords, etc? Yes  Adequate lighting in your home to reduce risk of falls? Yes   ASSISTIVE DEVICES UTILIZED TO PREVENT FALLS:  Life alert? No  Use of a cane, walker or w/c? Yes  Grab bars in the bathroom? Yes  Shower chair or bench in shower? Yes  Elevated toilet seat or a handicapped toilet? Yes   TIMED UP AND GO:  Was the test performed? No .  Length of time to ambulate 10 feet: 0 sec.     Cognitive Function:     6CIT Screen 01/21/2021  What Year? 0 points  What month? 0 points  What time? 0 points  Count back from 20 0 points  Months in reverse 0 points  Repeat phrase 0 points  Total Score 0    Immunizations Immunization History  Administered Date(s) Administered   PFIZER(Purple Top)SARS-COV-2 Vaccination 02/24/2019, 03/17/2019, 11/04/2019   Pneumococcal Conjugate-13 09/01/2014    TDAP status: Due, Education has been provided regarding the importance of this vaccine. Advised  may receive this vaccine at local pharmacy or Health Dept. Aware to provide a copy of the vaccination record if obtained from local pharmacy or Health Dept. Verbalized acceptance and understanding.  Flu Vaccine status: Declined, Education has been provided regarding the importance of this vaccine but patient still declined. Advised may receive this vaccine at local pharmacy or Health Dept. Aware to provide a copy of the vaccination record if obtained from local pharmacy or Health Dept. Verbalized acceptance and understanding.  Pneumococcal vaccine status: Due, Education has been provided regarding the importance of this vaccine. Advised may receive this vaccine at local pharmacy or Health Dept. Aware to provide a copy of the vaccination record if obtained from local pharmacy or Health Dept. Verbalized acceptance and understanding.  Covid-19 vaccine status: Information provided on how to obtain vaccines.   Qualifies for Shingles Vaccine? Yes   Zostavax completed No   Shingrix Completed?: No.    Education has been provided regarding the importance of this vaccine. Patient has been advised to call insurance company to determine out of pocket expense if they have not yet received this vaccine. Advised may also receive vaccine at local pharmacy or Health Dept. Verbalized acceptance and understanding.  Screening Tests Health Maintenance  Topic Date Due   Hepatitis C Screening  Never done   TETANUS/TDAP  Never done   Zoster Vaccines- Shingrix (1 of 2) Never done   Pneumonia Vaccine 67+ Years old (2 - PPSV23 if available, else PCV20) 09/01/2015   COVID-19 Vaccine (4 - Booster for Pfizer series) 12/30/2019   INFLUENZA VACCINE  Never done   COLONOSCOPY (Pts 45-40yr Insurance coverage will need to be confirmed)  11/21/2022   HPV VACCINES  Aged Out    Health Maintenance  Health Maintenance Due  Topic Date Due   Hepatitis C Screening  Never done   TETANUS/TDAP  Never done   Zoster Vaccines-  Shingrix (1 of 2) Never done   Pneumonia Vaccine 74 Years old (2 - PPSV23 if available, else PCV20) 09/01/2015   COVID-19 Vaccine (4 - Booster for Pfizer series) 12/30/2019   INFLUENZA VACCINE  Never done  Colorectal cancer screening: No longer required.   Lung Cancer Screening: (Low Dose CT Chest recommended if Age 71-80 years, 30 pack-year currently smoking OR have quit w/in 15years.) does not qualify.   Lung Cancer Screening Referral: No  Additional Screening:  Hepatitis C Screening: does not qualify; Completed No  Vision Screening: Recommended annual ophthalmology exams for early detection of glaucoma and other disorders of the eye. Is the patient up to date with their annual eye exam?  No  Who is the provider or what is the name of the office in which the patient attends annual eye exams? [facility] If pt is not established with a provider, would they like to be referred to a provider to establish care? No .   Dental Screening: Recommended annual dental exams for proper oral hygiene  Community Resource Referral / Chronic Care Management: CRR required this visit?  No   CCM required this visit?  No      Plan:     I have personally reviewed and noted the following in the patients chart:   Medical and social history Use of alcohol, tobacco or illicit drugs  Current medications and supplements including opioid prescriptions. Patient is not currently taking opioid prescriptions. Functional ability and status Nutritional status Physical activity Advanced directives List of other physicians Hospitalizations, surgeries, and ER visits in previous 12 months Vitals Screenings to include cognitive, depression, and falls Referrals and appointments  In addition, I have reviewed and discussed with patient certain preventive protocols, quality metrics, and best practice recommendations. A written personalized care plan for preventive services as well as general  preventive health recommendations were provided to patient.     Renato Gails, [place]   01/21/2021   Nurse Notes: Patient was informed of care gaps. Mr Vivona plans to come to the office to receive the flu, tdap and pneumonia vaccine. Diabetes supplies were ordered today for patient.

## 2021-01-21 NOTE — Progress Notes (Signed)
I have reviewed this visit and agree with the documentation.   

## 2021-02-16 ENCOUNTER — Other Ambulatory Visit: Payer: Self-pay | Admitting: Internal Medicine

## 2021-02-16 ENCOUNTER — Other Ambulatory Visit: Payer: Self-pay | Admitting: *Deleted

## 2021-02-16 DIAGNOSIS — R42 Dizziness and giddiness: Secondary | ICD-10-CM

## 2021-02-16 DIAGNOSIS — R7303 Prediabetes: Secondary | ICD-10-CM

## 2021-02-16 MED ORDER — BLOOD GLUCOSE MONITOR KIT: 1.0000 | PACK | Freq: Two times a day (BID) | 3 refills | Status: AC

## 2021-03-07 ENCOUNTER — Other Ambulatory Visit: Payer: Self-pay | Admitting: Internal Medicine

## 2021-03-07 DIAGNOSIS — R7303 Prediabetes: Secondary | ICD-10-CM

## 2021-04-07 ENCOUNTER — Other Ambulatory Visit: Payer: Self-pay | Admitting: Internal Medicine

## 2021-04-07 DIAGNOSIS — I10 Essential (primary) hypertension: Secondary | ICD-10-CM

## 2021-05-14 ENCOUNTER — Other Ambulatory Visit: Payer: Self-pay | Admitting: *Deleted

## 2021-07-05 DIAGNOSIS — H43811 Vitreous degeneration, right eye: Secondary | ICD-10-CM | POA: Diagnosis not present

## 2021-07-05 DIAGNOSIS — Z0100 Encounter for examination of eyes and vision without abnormal findings: Secondary | ICD-10-CM | POA: Diagnosis not present

## 2021-07-05 DIAGNOSIS — H2512 Age-related nuclear cataract, left eye: Secondary | ICD-10-CM | POA: Diagnosis not present

## 2021-07-05 LAB — HM DIABETES EYE EXAM

## 2021-08-06 ENCOUNTER — Encounter: Payer: Self-pay | Admitting: *Deleted

## 2021-10-25 DIAGNOSIS — H2512 Age-related nuclear cataract, left eye: Secondary | ICD-10-CM | POA: Diagnosis not present

## 2021-11-08 ENCOUNTER — Encounter: Payer: Self-pay | Admitting: Ophthalmology

## 2021-11-09 NOTE — Discharge Instructions (Signed)

## 2021-11-11 ENCOUNTER — Other Ambulatory Visit: Payer: Self-pay

## 2021-11-11 ENCOUNTER — Ambulatory Visit: Payer: Medicare HMO | Admitting: Anesthesiology

## 2021-11-11 ENCOUNTER — Encounter: Payer: Self-pay | Admitting: Ophthalmology

## 2021-11-11 ENCOUNTER — Ambulatory Visit
Admission: RE | Admit: 2021-11-11 | Discharge: 2021-11-11 | Disposition: A | Discharge status: Home / Self Care | Payer: Medicare HMO | Attending: Ophthalmology | Admitting: Ophthalmology

## 2021-11-11 ENCOUNTER — Encounter
Admission: RE | Disposition: A | Discharge status: Home / Self Care | Payer: Self-pay | Source: Home / Self Care | Attending: Ophthalmology

## 2021-11-11 DIAGNOSIS — Z7984 Long term (current) use of oral hypoglycemic drugs: Secondary | ICD-10-CM | POA: Insufficient documentation

## 2021-11-11 DIAGNOSIS — E785 Hyperlipidemia, unspecified: Secondary | ICD-10-CM | POA: Diagnosis not present

## 2021-11-11 DIAGNOSIS — I10 Essential (primary) hypertension: Secondary | ICD-10-CM | POA: Diagnosis not present

## 2021-11-11 DIAGNOSIS — H25042 Posterior subcapsular polar age-related cataract, left eye: Secondary | ICD-10-CM | POA: Diagnosis not present

## 2021-11-11 DIAGNOSIS — H2512 Age-related nuclear cataract, left eye: Secondary | ICD-10-CM | POA: Diagnosis not present

## 2021-11-11 DIAGNOSIS — H25812 Combined forms of age-related cataract, left eye: Secondary | ICD-10-CM | POA: Diagnosis not present

## 2021-11-11 DIAGNOSIS — E1136 Type 2 diabetes mellitus with diabetic cataract: Secondary | ICD-10-CM | POA: Diagnosis not present

## 2021-11-11 DIAGNOSIS — Z87891 Personal history of nicotine dependence: Secondary | ICD-10-CM | POA: Diagnosis not present

## 2021-11-11 HISTORY — PX: CATARACT EXTRACTION W/PHACO: SHX586

## 2021-11-11 LAB — GLUCOSE, CAPILLARY
Glucose-Capillary: 154 mg/dL — ABNORMAL HIGH (ref 70–99)
Glucose-Capillary: 146 mg/dL — ABNORMAL HIGH (ref 70–99)

## 2021-11-11 SURGERY — PHACOEMULSIFICATION, CATARACT, WITH IOL INSERTION
Anesthesia: Monitor Anesthesia Care | Site: Eye | Laterality: Left

## 2021-11-11 MED ORDER — ARMC OPHTHALMIC DILATING DROPS
1.0000 | OPHTHALMIC | Status: DC | PRN
  Administered 2021-11-11 (×3): 1 via OPHTHALMIC

## 2021-11-11 MED ORDER — LACTATED RINGERS IV SOLN: INTRAVENOUS | Status: DC

## 2021-11-11 MED ORDER — MIDAZOLAM HCL 2 MG/2ML IJ SOLN
INTRAMUSCULAR | Status: DC | PRN
  Administered 2021-11-11: 1 mg via INTRAVENOUS

## 2021-11-11 MED ORDER — MOXIFLOXACIN HCL 0.5 % OP SOLN
OPHTHALMIC | Status: DC | PRN
  Administered 2021-11-11: 0.2 mL via OPHTHALMIC

## 2021-11-11 MED ORDER — FENTANYL CITRATE (PF) 100 MCG/2ML IJ SOLN
INTRAMUSCULAR | Status: DC | PRN
  Administered 2021-11-11: 50 ug via INTRAVENOUS

## 2021-11-11 MED ORDER — SIGHTPATH DOSE#1 BSS IO SOLN
INTRAOCULAR | Status: DC | PRN
  Administered 2021-11-11: 15 mL

## 2021-11-11 MED ORDER — TETRACAINE HCL 0.5 % OP SOLN
1.0000 [drp] | OPHTHALMIC | Status: DC | PRN
  Administered 2021-11-11 (×3): 1 [drp] via OPHTHALMIC

## 2021-11-11 MED ORDER — SIGHTPATH DOSE#1 NA HYALUR & NA CHOND-NA HYALUR IO KIT
PACK | INTRAOCULAR | Status: DC | PRN
  Administered 2021-11-11: 1 via OPHTHALMIC

## 2021-11-11 MED ORDER — LIDOCAINE HCL (PF) 2 % IJ SOLN
INTRAOCULAR | Status: DC | PRN
  Administered 2021-11-11: 1 mL via INTRAOCULAR

## 2021-11-11 MED ORDER — SIGHTPATH DOSE#1 BSS IO SOLN
INTRAOCULAR | Status: DC | PRN
  Administered 2021-11-11: 112 mL via OPHTHALMIC

## 2021-11-11 MED ORDER — BRIMONIDINE TARTRATE-TIMOLOL 0.2-0.5 % OP SOLN
OPHTHALMIC | Status: DC | PRN
  Administered 2021-11-11: 1 [drp] via OPHTHALMIC

## 2021-11-11 SURGICAL SUPPLY — 17 items
CANNULA ANT/CHMB 27G (MISCELLANEOUS) IMPLANT
CANNULA ANT/CHMB 27GA (MISCELLANEOUS) IMPLANT
CATARACT SUITE SIGHTPATH (MISCELLANEOUS) ×1 IMPLANT
DISSECTOR HYDRO NUCLEUS 50X22 (MISCELLANEOUS) ×1 IMPLANT
DRSG TEGADERM 2-3/8X2-3/4 SM (GAUZE/BANDAGES/DRESSINGS) ×1 IMPLANT
FEE CATARACT SUITE SIGHTPATH (MISCELLANEOUS) ×1 IMPLANT
GLOVE SURG SYN 7.5  E (GLOVE) ×1
GLOVE SURG SYN 7.5 E (GLOVE) ×1 IMPLANT
GLOVE SURG SYN 7.5 PF PI (GLOVE) ×1 IMPLANT
GLOVE SURG SYN 8.5  E (GLOVE) ×1
GLOVE SURG SYN 8.5 E (GLOVE) ×1 IMPLANT
GLOVE SURG SYN 8.5 PF PI (GLOVE) ×1 IMPLANT
LENS IOL TECNIS EYHANCE 18.5 (Intraocular Lens) IMPLANT
NDL FILTER BLUNT 18X1 1/2 (NEEDLE) IMPLANT
NEEDLE FILTER BLUNT 18X1 1/2 (NEEDLE) ×2 IMPLANT
SYR 5ML LL (SYRINGE) IMPLANT
WATER STERILE IRR 250ML POUR (IV SOLUTION) ×1 IMPLANT

## 2021-11-11 NOTE — H&P (Signed)
[name]   Primary Care Physician:  Cletis Athens, MD Ophthalmologist: Dr. Merleen Nicely  Pre-Procedure History & Physical: HPI:  Albert Gray is a 76 y.o. male here for cataract surgery.   Past Medical History:  Diagnosis Date   Arthritis    Diabetes mellitus without complication ([place])    Hyperlipidemia    Hypertension     Past Surgical History:  Procedure Laterality Date   COLONOSCOPY  2002   Dr Jamal Collin x 2   [place] N/A 05/13/2019   Procedure: CYSTOSCOPY WITH FULGERATION;  Surgeon: Hollice Espy, MD;  Location: [facility];  Service: Urology;  Laterality: N/A;   CYSTOSCOPY WITH LITHOLAPAXY N/A 05/13/2019   Procedure: CYSTOSCOPY WITH LITHOLAPAXY;  Surgeon: Hollice Espy, MD;  Location: [facility];  Service: Urology;  Laterality: N/A;   HERNIA REPAIR  2000    Prior to Admission medications   Medication Sig Start Date End Date Taking? Authorizing Provider  ASPIRIN 81 PO Take by mouth.   Yes [provider]  furosemide (LASIX) 20 MG tablet Take 20 mg by mouth daily.   Yes [provider]  lisinopril-hydrochlorothiazide (ZESTORETIC) 20-12.5 MG tablet Take 1 tablet by mouth once daily 04/07/21  Yes Masoud, Viann Shove, MD  metFORMIN (GLUCOPHAGE) 500 MG tablet TAKE 1 TABLET BY MOUTH TWICE DAILY WITH A MEAL 03/08/21  Yes Masoud, Viann Shove, MD  tamsulosin (FLOMAX) 0.4 MG CAPS capsule Take 0.4 mg by mouth daily after breakfast.  02/17/19  Yes [provider]  acetaminophen (TYLENOL) 500 MG tablet Take 500 mg by mouth daily.    [provider]  blood glucose meter kit and supplies KIT 1 each by Other route 2 (two) times daily. Dispense based on patient and insurance preference. Test blood sugar BID. 02/16/21   Cletis Athens, MD  dapagliflozin propanediol (FARXIGA) 10 MG TABS tablet Take 10 mg by mouth daily. Patient not taking: Reported on 11/08/2021    [provider]  finasteride (PROSCAR) 5 MG tablet Take 5 mg by mouth every  morning.  Patient not taking: Reported on 11/08/2021    [provider]  glucose blood test strip 1 each by Other route 4 (four) times daily. Check blood sugar twice a day, morning before meal and evening 01/21/21   Cletis Athens, MD  meclizine (ANTIVERT) 12.5 MG tablet Take 1 tablet (12.5 mg total) by mouth 2 (two) times daily as needed for dizziness. 11/17/20   Cletis Athens, MD  polyethylene glycol (MIRALAX / GLYCOLAX) 17 g packet Take 17 g by mouth daily as needed (pain.).    [provider]  rosuvastatin (CRESTOR) 20 MG tablet Take 1 tablet (20 mg total) by mouth as needed. Patient not taking: Reported on 11/08/2021 11/17/20   Cletis Athens, MD  Semaglutide 3 MG TABS Take 3 mg by mouth daily. Patient not taking: Reported on 11/08/2021    [provider]    Allergies as of 09/02/2021   (No Known Allergies)    History reviewed. No pertinent family history.  Social History   Socioeconomic History   Marital status: Married    Spouse name: Not on file   Number of children: Not on file   Years of education: Not on file   Highest education level: Not on file  Occupational History   Not on file  Tobacco Use   Smoking status: Former    Types: Cigarettes    Quit date: 1970    Years since quitting: 53.8   Smokeless tobacco:  Never  Vaping Use   Vaping Use: Never used  Substance and Sexual Activity   Alcohol use: No   Drug use: No   Sexual activity: Not Currently  Other Topics Concern   Not on file  Social History Narrative   Not on file   Social Determinants of Health   Financial Resource Strain: Low Risk  (01/21/2021)   Overall Financial Resource Strain (CARDIA)    Difficulty of Paying Living Expenses: Not hard at all  Food Insecurity: No Food Insecurity (01/21/2021)   Hunger Vital Sign    Worried About Running Out of Food in the Last Year: Never true    Ran Out of Food in the Last Year: Never true  Transportation Needs: No Transportation  Needs (01/21/2021)   PRAPARE - Hydrologist (Medical): No    Lack of Transportation (Non-Medical): No  Physical Activity: Insufficiently Active (01/21/2021)   Exercise Vital Sign    Days of Exercise per Week: 5 days    Minutes of Exercise per Session: 10 min  Stress: No Stress Concern Present (01/21/2021)   [place]    Feeling of Stress : Not at all  Social Connections: [place] (01/21/2021)   Social Connection and Isolation Panel [NHANES]    Frequency of Communication with Friends and Family: More than three times a week    Frequency of Social Gatherings with Friends and Family: Three times a week    Attends Religious Services: More than 4 times per year    Active Member of Clubs or Organizations: Yes    Attends Archivist Meetings: More than 4 times per year    Marital Status: Married  Human resources officer Violence: Not At Risk (01/21/2021)   Humiliation, Afraid, Rape, and Kick questionnaire    Fear of Current or Ex-Partner: No    Emotionally Abused: No    Physically Abused: No    Sexually Abused: No    Review of Systems: See HPI, otherwise negative ROS  Physical Exam: Ht 5' 6"  (1.676 m)   Wt 95.3 kg   BMI 33.89 kg/m  General:   Alert, cooperative in NAD Head:  Normocephalic and atraumatic. Respiratory:  Normal work of breathing. Cardiovascular:  RRR  Impression/Plan: Albert Gray is here for cataract surgery.  Risks, benefits, limitations, and alternatives regarding cataract surgery have been reviewed with the patient.  Questions have been answered.  All parties agreeable.   Norvel Richards, MD  11/11/2021, 11:50 AM

## 2021-11-11 NOTE — Op Note (Signed)
OPERATIVE NOTE  MIRL HILLERY [id number] 11/11/2021   PREOPERATIVE DIAGNOSIS: Nuclear sclerotic cataract left eye. H25.12   POSTOPERATIVE DIAGNOSIS: Nuclear sclerotic cataract left eye. H25.12   PROCEDURE:  Phacoemusification with posterior chamber intraocular lens placement of the left eye  Ultrasound time: Procedure(s) with comments: CATARACT EXTRACTION PHACO AND INTRAOCULAR LENS PLACEMENT (IOC) LEFT DIABETIC 36.00 02:47.1 (Left) - Diabetic  LENS:   Implant Name Type Inv. Item Serial No. Manufacturer Lot No. LRB No. Used Action  LENS IOL TECNIS EYHANCE 18.5 - [id number] Intraocular Lens LENS IOL TECNIS EYHANCE 18.5 [id number] [employer]  Left 1 Implanted      SURGEON:  Courtney Heys. Lazarus Salines, MD   ANESTHESIA:  Topical with tetracaine drops, augmented with 1% preservative-free intracameral lidocaine.   COMPLICATIONS:  None.   DESCRIPTION OF PROCEDURE:  The patient was identified in the holding room and transported to the operating room and placed in the supine position under the operating microscope.  The left eye was identified as the operative eye, which was prepped and draped in the usual sterile ophthalmic fashion.   A 1 millimeter clear-corneal paracentesis was made inferotemporally. Preservative-free 1% lidocaine mixed with 1:1,000 bisulfite-free aqueous solution of epinephrine was injected into the anterior chamber. The anterior chamber was then filled with Viscoat viscoelastic. A 2.4 millimeter keratome was used to make a clear-corneal incision superotemporally. A curvilinear capsulorrhexis was made with a cystotome and capsulorrhexis forceps. Balanced salt solution was used to hydrodissect and hydrodelineate the nucleus. Phacoemulsification was then used to remove the lens nucleus and epinucleus. The remaining cortex was then removed using the irrigation and aspiration handpiece. Provisc was then placed into the capsular bag to distend it for lens placement. A +18.50 D DIB00  intraocular lens was then injected into the capsular bag. The remaining viscoelastic was aspirated.   Wounds were hydrated with balanced salt solution.  The anterior chamber was inflated to a physiologic pressure with balanced salt solution.  No wound leaks were noted. Vigamox was injected intracamerally.  Timolol and Brimonidine drops were applied to the eye.  The patient was taken to the recovery room in stable condition without complications of anesthesia or surgery.  Maryann Alar McCarr 11/11/2021, 1:17 PM

## 2021-11-11 NOTE — Transfer of Care (Signed)
Immediate Anesthesia Transfer of Care Note  Patient: Albert Gray  Procedure(s) Performed: CATARACT EXTRACTION PHACO AND INTRAOCULAR LENS PLACEMENT (IOC) LEFT DIABETIC 36.00 02:47.1 (Left: Eye)  Patient Location: PACU  Anesthesia Type: MAC  Level of Consciousness: awake, alert  and patient cooperative  Airway and Oxygen Therapy: Patient Spontanous Breathing and Patient connected to supplemental oxygen  Post-op Assessment: Post-op Vital signs reviewed, Patient's Cardiovascular Status Stable, Respiratory Function Stable, Patent Airway and No signs of Nausea or vomiting  Post-op Vital Signs: Reviewed and stable  Complications: There were no known notable events for this encounter.

## 2021-11-11 NOTE — Anesthesia Preprocedure Evaluation (Signed)
Anesthesia Evaluation  Patient identified by MRN, date of birth, ID band Patient awake    Reviewed: Allergy & Precautions, NPO status , Patient's Chart, lab work & pertinent test results  History of Anesthesia Complications Negative for: history of anesthetic complications  Airway Mallampati: III       Dental   Pulmonary neg shortness of breath, neg sleep apnea, neg COPD, neg recent URI, Not current smoker, former smoker,           Cardiovascular hypertension, Pt. on medications (-) Past MI and (-) CHF (-) dysrhythmias (-) Valvular Problems/Murmurs     Neuro/Psych neg Seizures    GI/Hepatic Neg liver ROS, neg GERD  ,  Endo/Other  diabetes, Type 2, Oral Hypoglycemic Agents  Renal/GU negative Renal ROS     Musculoskeletal   Abdominal   Peds  Hematology   Anesthesia Other Findings Past Medical History: No date: Arthritis No date: Diabetes mellitus without complication (HCC) No date: Hyperlipidemia No date: Hypertension   Reproductive/Obstetrics                             Anesthesia Physical  Anesthesia Plan  ASA: 3  Anesthesia Plan: MAC   Post-op Pain Management:    Induction: Intravenous  PONV Risk Score and Plan: 2 and Midazolam  Airway Management Planned: Natural Airway and Nasal Cannula  Additional Equipment:   Intra-op Plan:   Post-operative Plan:   Informed Consent: I have reviewed the patients History and Physical, chart, labs and discussed the procedure including the risks, benefits and alternatives for the proposed anesthesia with the patient or authorized representative who has indicated his/her understanding and acceptance.       Plan Discussed with:   Anesthesia Plan Comments:         Anesthesia Quick Evaluation

## 2021-11-11 NOTE — Anesthesia Postprocedure Evaluation (Signed)
Anesthesia Post Note  Patient: Albert Gray  Procedure(s) Performed: CATARACT EXTRACTION PHACO AND INTRAOCULAR LENS PLACEMENT (IOC) LEFT DIABETIC 36.00 02:47.1 (Left: Eye)  Patient location during evaluation: PACU Anesthesia Type: MAC Level of consciousness: awake and alert Pain management: pain level controlled Vital Signs Assessment: post-procedure vital signs reviewed and stable Respiratory status: spontaneous breathing, nonlabored ventilation, respiratory function stable and patient connected to nasal cannula oxygen Cardiovascular status: stable and blood pressure returned to baseline Postop Assessment: no apparent nausea or vomiting Anesthetic complications: no   There were no known notable events for this encounter.   Last Vitals:  Vitals:   11/11/21 1318 11/11/21 1324  BP: 136/81 121/76  Pulse: 73 78  Resp: 20 20  Temp: (!) 36.1 C (!) 36.4 C  SpO2: 97% 97%    Last Pain:  Vitals:   11/11/21 1324  PainSc: 0-No pain                 Martha Clan

## 2021-11-12 ENCOUNTER — Encounter: Payer: Self-pay | Admitting: Ophthalmology

## 2021-11-15 ENCOUNTER — Other Ambulatory Visit: Payer: Self-pay

## 2021-11-15 ENCOUNTER — Encounter: Payer: Self-pay | Admitting: Ophthalmology

## 2021-11-19 DIAGNOSIS — H2511 Age-related nuclear cataract, right eye: Secondary | ICD-10-CM | POA: Diagnosis not present

## 2021-11-23 NOTE — Discharge Instructions (Addendum)

## 2021-11-25 ENCOUNTER — Other Ambulatory Visit: Payer: Self-pay

## 2021-11-25 ENCOUNTER — Ambulatory Visit: Payer: Medicare HMO | Admitting: Anesthesiology

## 2021-11-25 ENCOUNTER — Encounter
Admission: RE | Disposition: A | Discharge status: Home / Self Care | Payer: Self-pay | Source: Home / Self Care | Attending: Ophthalmology

## 2021-11-25 ENCOUNTER — Ambulatory Visit
Admission: RE | Admit: 2021-11-25 | Discharge: 2021-11-25 | Disposition: A | Discharge status: Home / Self Care | Payer: Medicare HMO | Attending: Ophthalmology | Admitting: Ophthalmology

## 2021-11-25 DIAGNOSIS — E119 Type 2 diabetes mellitus without complications: Secondary | ICD-10-CM | POA: Diagnosis not present

## 2021-11-25 DIAGNOSIS — E1136 Type 2 diabetes mellitus with diabetic cataract: Secondary | ICD-10-CM | POA: Insufficient documentation

## 2021-11-25 DIAGNOSIS — Z87891 Personal history of nicotine dependence: Secondary | ICD-10-CM | POA: Diagnosis not present

## 2021-11-25 DIAGNOSIS — Z7984 Long term (current) use of oral hypoglycemic drugs: Secondary | ICD-10-CM | POA: Insufficient documentation

## 2021-11-25 DIAGNOSIS — H25811 Combined forms of age-related cataract, right eye: Secondary | ICD-10-CM | POA: Diagnosis not present

## 2021-11-25 DIAGNOSIS — H2511 Age-related nuclear cataract, right eye: Secondary | ICD-10-CM | POA: Diagnosis not present

## 2021-11-25 DIAGNOSIS — E785 Hyperlipidemia, unspecified: Secondary | ICD-10-CM | POA: Diagnosis not present

## 2021-11-25 DIAGNOSIS — I10 Essential (primary) hypertension: Secondary | ICD-10-CM | POA: Diagnosis not present

## 2021-11-25 HISTORY — PX: CATARACT EXTRACTION W/PHACO: SHX586

## 2021-11-25 LAB — GLUCOSE, CAPILLARY: Glucose-Capillary: 128 mg/dL — ABNORMAL HIGH (ref 70–99)

## 2021-11-25 SURGERY — PHACOEMULSIFICATION, CATARACT, WITH IOL INSERTION
Anesthesia: Monitor Anesthesia Care | Site: Eye | Laterality: Right

## 2021-11-25 MED ORDER — SIGHTPATH DOSE#1 NA HYALUR & NA CHOND-NA HYALUR IO KIT
PACK | INTRAOCULAR | Status: DC | PRN
  Administered 2021-11-25: 1 via OPHTHALMIC

## 2021-11-25 MED ORDER — FENTANYL CITRATE PF 50 MCG/ML IJ SOSY: 25.0000 ug | PREFILLED_SYRINGE | INTRAMUSCULAR | Status: DC | PRN

## 2021-11-25 MED ORDER — ONDANSETRON HCL 4 MG/2ML IJ SOLN: 4.0000 mg | Freq: Once | INTRAMUSCULAR | Status: DC | PRN

## 2021-11-25 MED ORDER — ARMC OPHTHALMIC DILATING DROPS
1.0000 | OPHTHALMIC | Status: DC | PRN
  Administered 2021-11-25 (×3): 1 via OPHTHALMIC

## 2021-11-25 MED ORDER — FENTANYL CITRATE (PF) 100 MCG/2ML IJ SOLN
INTRAMUSCULAR | Status: DC | PRN
  Administered 2021-11-25: 50 ug via INTRAVENOUS

## 2021-11-25 MED ORDER — MIDAZOLAM HCL 2 MG/2ML IJ SOLN
INTRAMUSCULAR | Status: DC | PRN
  Administered 2021-11-25: 2 mg via INTRAVENOUS

## 2021-11-25 MED ORDER — SIGHTPATH DOSE#1 BSS IO SOLN
INTRAOCULAR | Status: DC | PRN
  Administered 2021-11-25: 15 mL

## 2021-11-25 MED ORDER — MOXIFLOXACIN HCL 0.5 % OP SOLN
OPHTHALMIC | Status: DC | PRN
  Administered 2021-11-25: .2 mL via OPHTHALMIC

## 2021-11-25 MED ORDER — LIDOCAINE HCL (PF) 2 % IJ SOLN
INTRAOCULAR | Status: DC | PRN
  Administered 2021-11-25: 1 mL via INTRAOCULAR

## 2021-11-25 MED ORDER — LACTATED RINGERS IV SOLN: INTRAVENOUS | Status: DC

## 2021-11-25 MED ORDER — BRIMONIDINE TARTRATE-TIMOLOL 0.2-0.5 % OP SOLN
OPHTHALMIC | Status: DC | PRN
  Administered 2021-11-25: 1 [drp] via OPHTHALMIC

## 2021-11-25 MED ORDER — TETRACAINE HCL 0.5 % OP SOLN
1.0000 [drp] | OPHTHALMIC | Status: DC | PRN
  Administered 2021-11-25 (×3): 1 [drp] via OPHTHALMIC

## 2021-11-25 MED ORDER — SIGHTPATH DOSE#1 BSS IO SOLN
INTRAOCULAR | Status: DC | PRN
  Administered 2021-11-25: 120 mL via OPHTHALMIC

## 2021-11-25 SURGICAL SUPPLY — 12 items
CATARACT SUITE SIGHTPATH (MISCELLANEOUS) ×1 IMPLANT
DISSECTOR HYDRO NUCLEUS 50X22 (MISCELLANEOUS) ×1 IMPLANT
DRSG TEGADERM 2-3/8X2-3/4 SM (GAUZE/BANDAGES/DRESSINGS) ×1 IMPLANT
FEE CATARACT SUITE SIGHTPATH (MISCELLANEOUS) ×1 IMPLANT
GLOVE SURG SYN 7.5  E (GLOVE) ×1
GLOVE SURG SYN 7.5 E (GLOVE) ×1 IMPLANT
GLOVE SURG SYN 7.5 PF PI (GLOVE) ×1 IMPLANT
GLOVE SURG SYN 8.5  E (GLOVE) ×1
GLOVE SURG SYN 8.5 E (GLOVE) ×1 IMPLANT
GLOVE SURG SYN 8.5 PF PI (GLOVE) ×1 IMPLANT
LENS IOL TECNIS EYHANCE 13.5 (Intraocular Lens) IMPLANT
WATER STERILE IRR 250ML POUR (IV SOLUTION) ×1 IMPLANT

## 2021-11-25 NOTE — Op Note (Signed)
OPERATIVE NOTE  SANDRA TELLEFSEN [id number] 11/25/2021   PREOPERATIVE DIAGNOSIS: Nuclear sclerotic cataract right eye. H25.11   POSTOPERATIVE DIAGNOSIS: Nuclear sclerotic cataract right eye. H25.11   PROCEDURE:  Phacoemusification with posterior chamber intraocular lens placement of the right eye  Ultrasound time: Procedure(s) with comments: CATARACT EXTRACTION PHACO AND INTRAOCULAR LENS PLACEMENT (IOC) RIGHT DIABETIC 24.14  02:11.8 (Right) - Diabetic  LENS:   Implant Name Type Inv. Item Serial No. Manufacturer Lot No. LRB No. Used Action  LENS IOL TECNIS EYHANCE 13.5 - [id number] Intraocular Lens LENS IOL TECNIS EYHANCE 13.5 [id number] [employer]  Right 1 Implanted      SURGEON:  Courtney Heys. Lazarus Salines, MD   ANESTHESIA:  Topical with tetracaine drops, augmented with 1% preservative-free intracameral lidocaine.   COMPLICATIONS:  None.   DESCRIPTION OF PROCEDURE:  The patient was identified in the holding room and transported to the operating room and placed in the supine position under the operating microscope.  The right eye was identified as the operative eye, which was prepped and draped in the usual sterile ophthalmic fashion.   A 1 millimeter clear-corneal paracentesis was made superotemporally. Preservative-free 1% lidocaine mixed with 1:1,000 bisulfite-free aqueous solution of epinephrine was injected into the anterior chamber. The anterior chamber was then filled with Viscoat viscoelastic. A 2.4 millimeter keratome was used to make a clear-corneal incision inferotemporally. A curvilinear capsulorrhexis was made with a cystotome and capsulorrhexis forceps. Balanced salt solution was used to hydrodissect and hydrodelineate the nucleus. Phacoemulsification was then used to remove the lens nucleus and epinucleus. The remaining cortex was then removed using the irrigation and aspiration handpiece. Provisc was then placed into the capsular bag to distend it for lens placement. A +13.50 D DIB00  intraocular lens was then injected into the capsular bag. The remaining viscoelastic was aspirated.   Wounds were hydrated with balanced salt solution.  The anterior chamber was inflated to a physiologic pressure with balanced salt solution.  No wound leaks were noted. Vigamox was injected intracamerally.  Timolol and Brimonidine drops were applied to the eye.  The patient was taken to the recovery room in stable condition without complications of anesthesia or surgery.  Maryann Alar [place]ma 11/25/2021, 12:31 PM

## 2021-11-25 NOTE — H&P (Signed)
[place]   Primary Care Physician:  Cletis Athens, MD Ophthalmologist: Dr. Merleen Nicely  Pre-Procedure History & Physical: HPI:  Albert Gray is a 76 y.o. male here for cataract surgery.   Past Medical History:  Diagnosis Date   Arthritis    Diabetes mellitus without complication ([place])    Hyperlipidemia    Hypertension     Past Surgical History:  Procedure Laterality Date   CATARACT EXTRACTION W/PHACO Left 11/11/2021   Procedure: CATARACT EXTRACTION PHACO AND INTRAOCULAR LENS PLACEMENT (IOC) LEFT DIABETIC 36.00 02:47.1;  Surgeon: Norvel Richards, MD;  Location: [place];  Service: Ophthalmology;  Laterality: Left;  Diabetic   COLONOSCOPY  2002   Dr Jamal Collin x 2   [place] N/A 05/13/2019   Procedure: CYSTOSCOPY WITH FULGERATION;  Surgeon: Hollice Espy, MD;  Location: [facility];  Service: Urology;  Laterality: N/A;   CYSTOSCOPY WITH LITHOLAPAXY N/A 05/13/2019   Procedure: CYSTOSCOPY WITH LITHOLAPAXY;  Surgeon: Hollice Espy, MD;  Location: [facility];  Service: Urology;  Laterality: N/A;   HERNIA REPAIR  2000    Prior to Admission medications   Medication Sig Start Date End Date Taking? Authorizing Provider  acetaminophen (TYLENOL) 500 MG tablet Take 500 mg by mouth daily.   Yes [provider]  ASPIRIN 81 PO Take by mouth.   Yes [provider]  furosemide (LASIX) 20 MG tablet Take 20 mg by mouth daily.   Yes [provider]  lisinopril-hydrochlorothiazide (ZESTORETIC) 20-12.5 MG tablet Take 1 tablet by mouth once daily 04/07/21  Yes Masoud, Viann Shove, MD  meclizine (ANTIVERT) 12.5 MG tablet Take 1 tablet (12.5 mg total) by mouth 2 (two) times daily as needed for dizziness. 11/17/20  Yes Masoud, Viann Shove, MD  metFORMIN (GLUCOPHAGE) 500 MG tablet TAKE 1 TABLET BY MOUTH TWICE DAILY WITH A MEAL 03/08/21  Yes Masoud, Viann Shove, MD  polyethylene glycol (MIRALAX / GLYCOLAX) 17 g packet Take 17 g by mouth daily as needed  (pain.).   Yes [provider]  tamsulosin (FLOMAX) 0.4 MG CAPS capsule Take 0.4 mg by mouth daily after breakfast.  02/17/19  Yes [provider]  blood glucose meter kit and supplies KIT 1 each by Other route 2 (two) times daily. Dispense based on patient and insurance preference. Test blood sugar BID. 02/16/21   Cletis Athens, MD  dapagliflozin propanediol (FARXIGA) 10 MG TABS tablet Take 10 mg by mouth daily. Patient not taking: Reported on 11/08/2021    [provider]  finasteride (PROSCAR) 5 MG tablet Take 5 mg by mouth every morning.  Patient not taking: Reported on 11/08/2021    [provider]  glucose blood test strip 1 each by Other route 4 (four) times daily. Check blood sugar twice a day, morning before meal and evening 01/21/21   Cletis Athens, MD  rosuvastatin (CRESTOR) 20 MG tablet Take 1 tablet (20 mg total) by mouth as needed. Patient not taking: Reported on 11/08/2021 11/17/20   Cletis Athens, MD  Semaglutide 3 MG TABS Take 3 mg by mouth daily. Patient not taking: Reported on 11/08/2021    [provider]    Allergies as of 10/26/2021   (No Known Allergies)    History reviewed. No pertinent family history.  Social History   Socioeconomic History   Marital status: Married    Spouse name: Not on file   Number of children: Not on file   Years of education: Not on file   Highest education level:  Not on file  Occupational History   Not on file  Tobacco Use   Smoking status: Former    Types: Cigarettes    Quit date: 1970    Years since quitting: 53.8   Smokeless tobacco: Never  Vaping Use   Vaping Use: Never used  Substance and Sexual Activity   Alcohol use: No   Drug use: No   Sexual activity: Not Currently  Other Topics Concern   Not on file  Social History Narrative   Not on file   Social Determinants of Health   Financial Resource Strain: Low Risk  (01/21/2021)   Overall Financial Resource Strain (CARDIA)     Difficulty of Paying Living Expenses: Not hard at all  Food Insecurity: No Food Insecurity (01/21/2021)   Hunger Vital Sign    Worried About Running Out of Food in the Last Year: Never true    Ran Out of Food in the Last Year: Never true  Transportation Needs: No Transportation Needs (01/21/2021)   PRAPARE - Hydrologist (Medical): No    Lack of Transportation (Non-Medical): No  Physical Activity: Insufficiently Active (01/21/2021)   Exercise Vital Sign    Days of Exercise per Week: 5 days    Minutes of Exercise per Session: 10 min  Stress: No Stress Concern Present (01/21/2021)   [place]    Feeling of Stress : Not at all  Social Connections: [place] (01/21/2021)   Social Connection and Isolation Panel [NHANES]    Frequency of Communication with Friends and Family: More than three times a week    Frequency of Social Gatherings with Friends and Family: Three times a week    Attends Religious Services: More than 4 times per year    Active Member of Clubs or Organizations: Yes    Attends Archivist Meetings: More than 4 times per year    Marital Status: Married  Human resources officer Violence: Not At Risk (01/21/2021)   Humiliation, Afraid, Rape, and Kick questionnaire    Fear of Current or Ex-Partner: No    Emotionally Abused: No    Physically Abused: No    Sexually Abused: No    Review of Systems: See HPI, otherwise negative ROS  Physical Exam: BP 120/78   Pulse 68   Temp (!) 97.5 F (36.4 C) (Temporal)   Resp 18   Ht _0  (1.702 m)   Wt 95.3 kg   SpO2 99%   BMI 32.89 kg/m  General:   Alert, cooperative in NAD Head:  Normocephalic and atraumatic. Respiratory:  Normal work of breathing. Cardiovascular:  RRR  Impression/Plan: [place] is here for cataract surgery.  Risks, benefits, limitations, and alternatives regarding cataract surgery have  been reviewed with the patient.  Questions have been answered.  All parties agreeable.   Norvel Richards, MD  11/25/2021, 11:36 AM

## 2021-11-25 NOTE — Anesthesia Preprocedure Evaluation (Signed)
Anesthesia Evaluation  Patient identified by MRN, date of birth, ID band Patient awake    Reviewed: Allergy & Precautions, H&P , NPO status , Patient's Chart, lab work & pertinent test results, reviewed documented beta blocker date and time   Airway Mallampati: II  TM Distance: >3 FB Neck ROM: full    Dental no notable dental hx. (+) Teeth Intact   Pulmonary neg pulmonary ROS, former smoker   Pulmonary exam normal breath sounds clear to auscultation       Cardiovascular Exercise Tolerance: Good hypertension, On Medications negative cardio ROS  Rhythm:regular Rate:Normal     Neuro/Psych negative neurological ROS  negative psych ROS   GI/Hepatic negative GI ROS, Neg liver ROS,,,  Endo/Other  negative endocrine ROSdiabetes, Well Controlled    Renal/GU      Musculoskeletal   Abdominal   Peds  Hematology negative hematology ROS (+)   Anesthesia Other Findings   Reproductive/Obstetrics negative OB ROS                             Anesthesia Physical Anesthesia Plan  ASA: 2  Anesthesia Plan: MAC   Post-op Pain Management:    Induction:   PONV Risk Score and Plan:   Airway Management Planned:   Additional Equipment:   Intra-op Plan:   Post-operative Plan:   Informed Consent: I have reviewed the patients History and Physical, chart, labs and discussed the procedure including the risks, benefits and alternatives for the proposed anesthesia with the patient or authorized representative who has indicated his/her understanding and acceptance.       Plan Discussed with: CRNA  Anesthesia Plan Comments:        Anesthesia Quick Evaluation  

## 2021-11-25 NOTE — Transfer of Care (Signed)
Immediate Anesthesia Transfer of Care Note  Patient: Albert Gray  Procedure(s) Performed: CATARACT EXTRACTION PHACO AND INTRAOCULAR LENS PLACEMENT (IOC) RIGHT DIABETIC 24.14  02:11.8 (Right: Eye)  Patient Location: PACU  Anesthesia Type: MAC  Level of Consciousness: awake, alert  and patient cooperative  Airway and Oxygen Therapy: Patient Spontanous Breathing and Patient connected to supplemental oxygen  Post-op Assessment: Post-op Vital signs reviewed, Patient's Cardiovascular Status Stable, Respiratory Function Stable, Patent Airway and No signs of Nausea or vomiting  Post-op Vital Signs: Reviewed and stable  Complications: There were no known notable events for this encounter.

## 2021-11-26 ENCOUNTER — Encounter: Payer: Self-pay | Admitting: Ophthalmology

## 2021-11-29 NOTE — Anesthesia Postprocedure Evaluation (Signed)
Anesthesia Post Note  Patient: Albert Gray  Procedure(s) Performed: CATARACT EXTRACTION PHACO AND INTRAOCULAR LENS PLACEMENT (IOC) RIGHT DIABETIC 24.14  02:11.8 (Right: Eye)  Patient location during evaluation: PACU Anesthesia Type: MAC Level of consciousness: awake and alert Pain management: pain level controlled Vital Signs Assessment: post-procedure vital signs reviewed and stable Respiratory status: spontaneous breathing, nonlabored ventilation, respiratory function stable and patient connected to nasal cannula oxygen Cardiovascular status: blood pressure returned to baseline and stable Postop Assessment: no apparent nausea or vomiting Anesthetic complications: no   There were no known notable events for this encounter.   Last Vitals:  Vitals:   11/25/21 1238 11/25/21 1240  BP: 100/62   Pulse: 66 66  Resp: 15 18  Temp:    SpO2: 97% 98%    Last Pain:  Vitals:   11/26/21 0926  TempSrc:   PainSc: 0-No pain                 Molli Barrows

## 2022-01-05 DIAGNOSIS — Z961 Presence of intraocular lens: Secondary | ICD-10-CM | POA: Diagnosis not present

## 2022-07-15 DIAGNOSIS — E119 Type 2 diabetes mellitus without complications: Secondary | ICD-10-CM | POA: Diagnosis not present

## 2022-07-15 DIAGNOSIS — H52223 Regular astigmatism, bilateral: Secondary | ICD-10-CM | POA: Diagnosis not present

## 2022-07-15 DIAGNOSIS — H5203 Hypermetropia, bilateral: Secondary | ICD-10-CM | POA: Diagnosis not present

## 2023-07-18 DIAGNOSIS — H52223 Regular astigmatism, bilateral: Secondary | ICD-10-CM | POA: Diagnosis not present

## 2023-07-18 DIAGNOSIS — E119 Type 2 diabetes mellitus without complications: Secondary | ICD-10-CM | POA: Diagnosis not present

## 2023-08-21 DIAGNOSIS — R2689 Other abnormalities of gait and mobility: Secondary | ICD-10-CM | POA: Diagnosis not present

## 2023-08-21 DIAGNOSIS — R0789 Other chest pain: Secondary | ICD-10-CM | POA: Diagnosis not present

## 2023-08-21 DIAGNOSIS — Z125 Encounter for screening for malignant neoplasm of prostate: Secondary | ICD-10-CM | POA: Diagnosis not present

## 2023-08-21 DIAGNOSIS — R531 Weakness: Secondary | ICD-10-CM | POA: Diagnosis not present

## 2023-08-21 DIAGNOSIS — Z7689 Persons encountering health services in other specified circumstances: Secondary | ICD-10-CM | POA: Diagnosis not present

## 2023-08-21 DIAGNOSIS — E1165 Type 2 diabetes mellitus with hyperglycemia: Secondary | ICD-10-CM | POA: Diagnosis not present

## 2023-08-21 DIAGNOSIS — Z0000 Encounter for general adult medical examination without abnormal findings: Secondary | ICD-10-CM | POA: Diagnosis not present

## 2023-08-21 DIAGNOSIS — Z1331 Encounter for screening for depression: Secondary | ICD-10-CM | POA: Diagnosis not present

## 2023-08-21 DIAGNOSIS — E119 Type 2 diabetes mellitus without complications: Secondary | ICD-10-CM | POA: Diagnosis not present

## 2023-09-13 ENCOUNTER — Ambulatory Visit: Payer: Self-pay | Admitting: Cardiology

## 2023-09-23 IMAGING — CT CT HEAD W/O CM
4 series · 17 of 47 positions shown, 19 images · non-contrast
Comparison: None.

CLINICAL DATA: Dizziness off and on, has now resolved

EXAM:
CT HEAD WITHOUT CONTRAST
TECHNIQUE: Contiguous axial images were obtained from the base of the skull
through the vertex without intravenous contrast.

[Series 2: head bone · axial · 0.44mm/px · z∈[-158,-102]mm · 4 of 82 slices shown]
[im 9/82  bone]
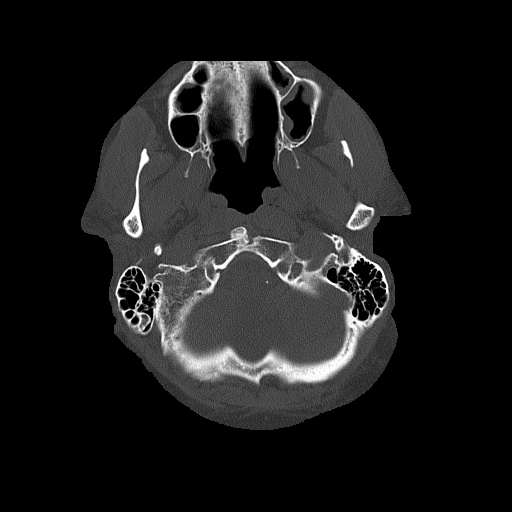
[im 17/82  bone]
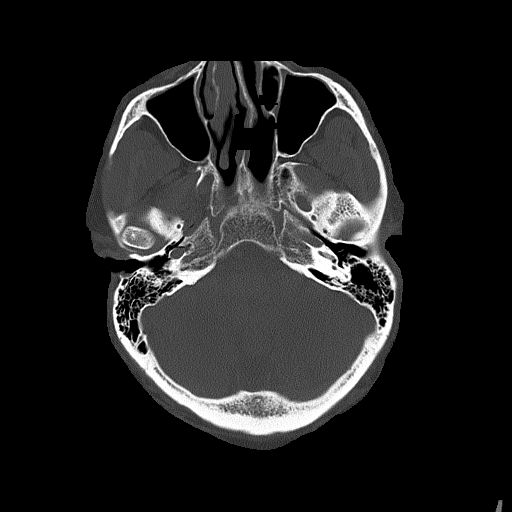
[im 25/82  bone]
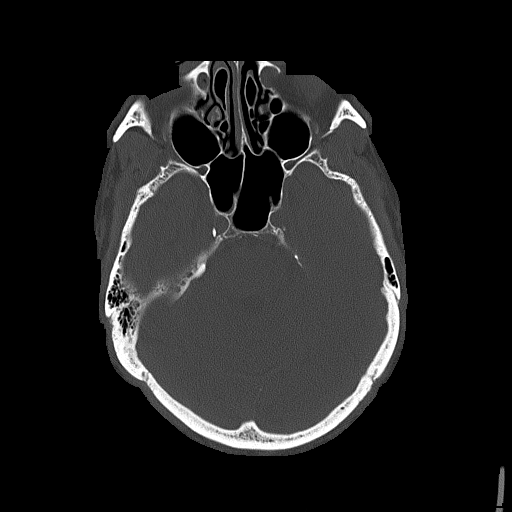
[im 37/82  bone]
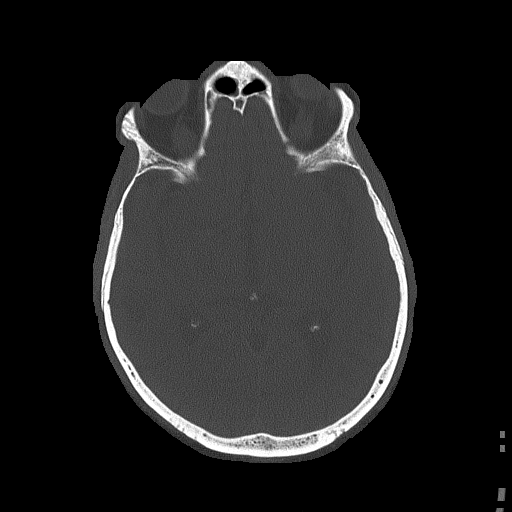

[Series 3: head wo · axial · 0.44mm/px · z∈[-154,-34]mm · 7 of 33 slices shown, 9 images]
[im 5/33  brain]
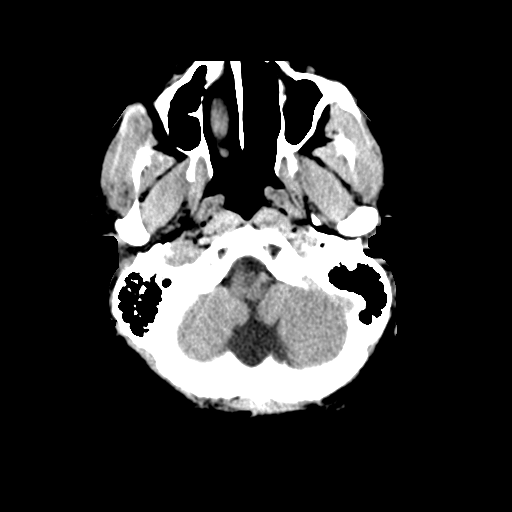
[im 5/33  bone]
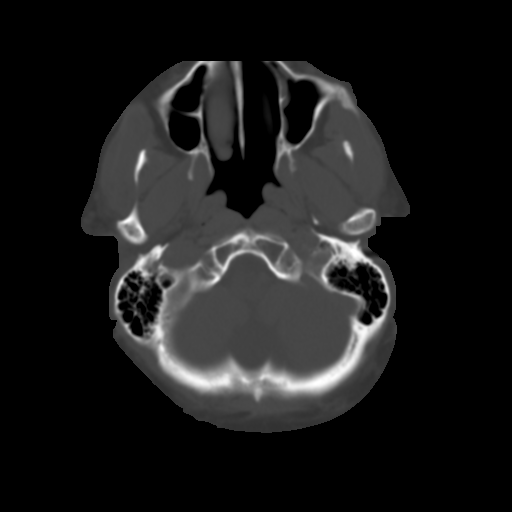
[im 9/33  brain]
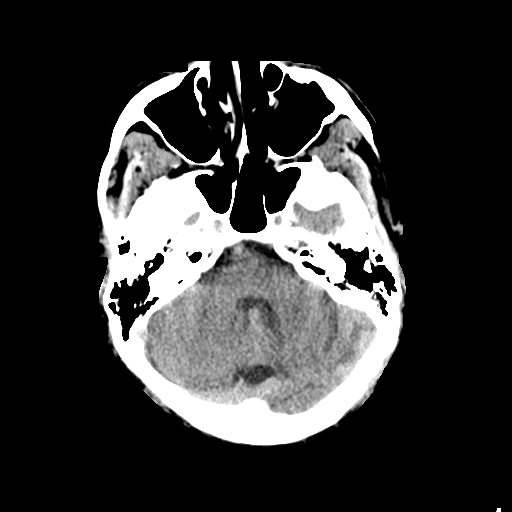
[im 13/33  brain]
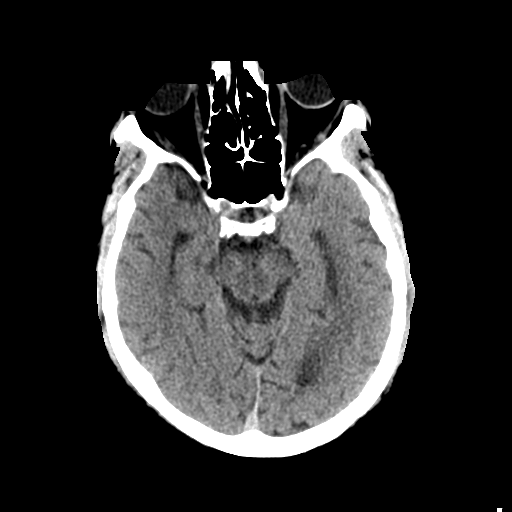
[im 17/33  brain]
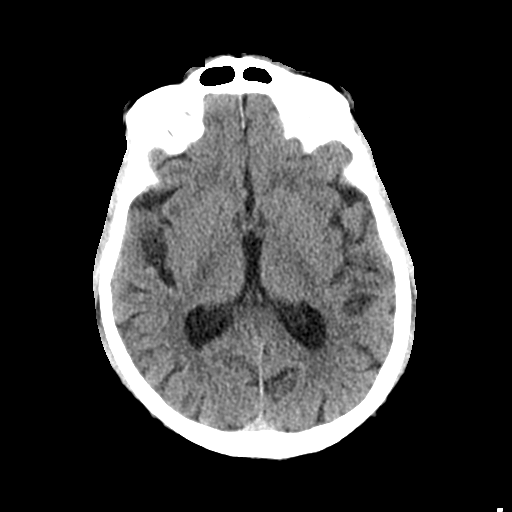
[im 21/33  brain]
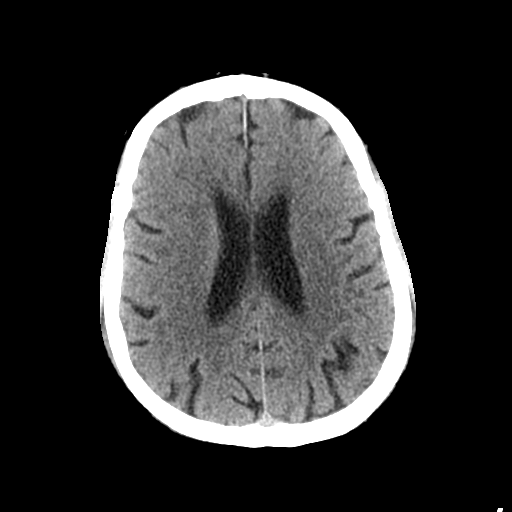
[im 21/33  bone]
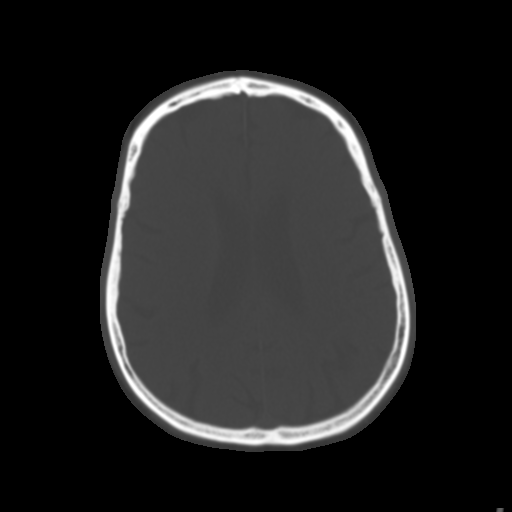
[im 25/33  brain]
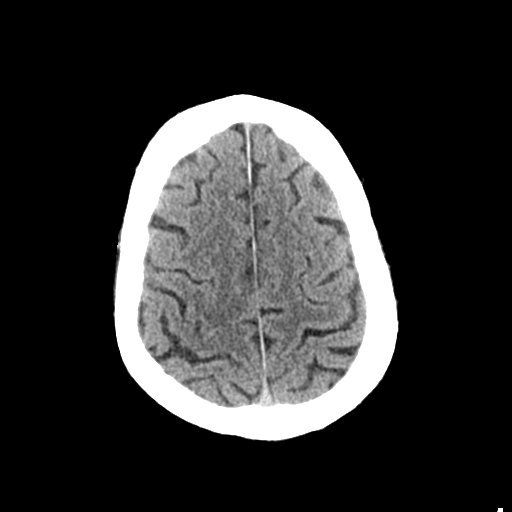
[im 29/33  brain]
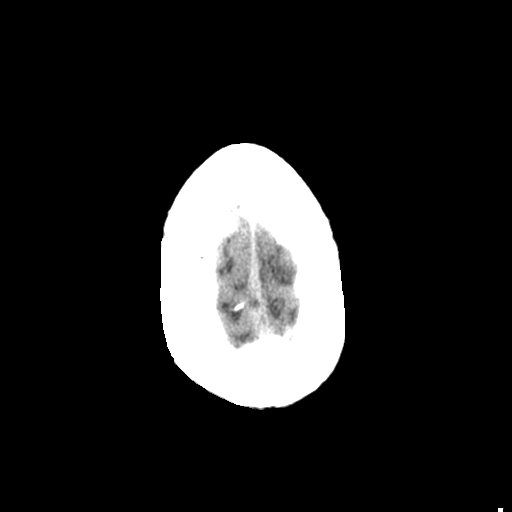

[Series 4: coronal soft tissue · coronal · 0.35mm/px · 3 of 77 slices shown]
[im 26/77  brain]
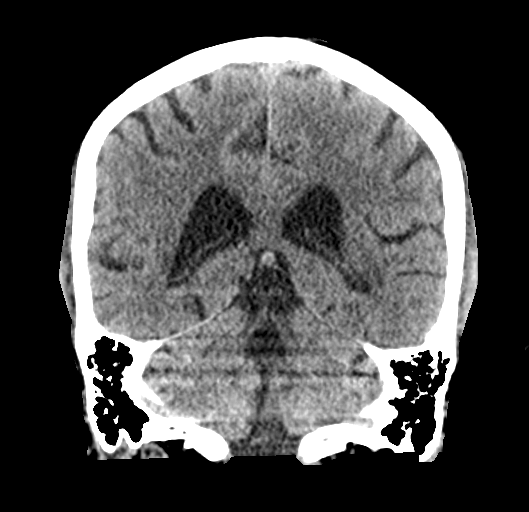
[im 34/77  brain]
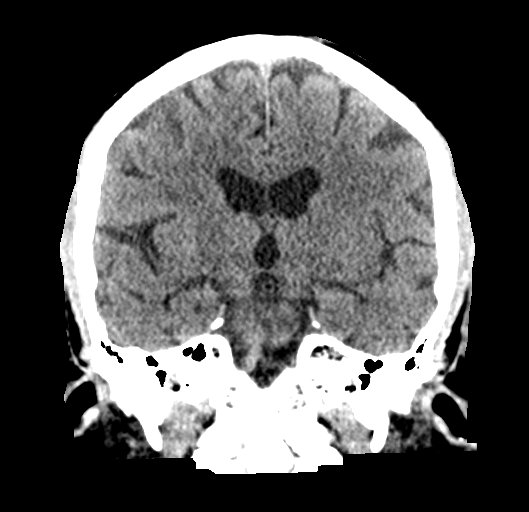
[im 43/77  brain]
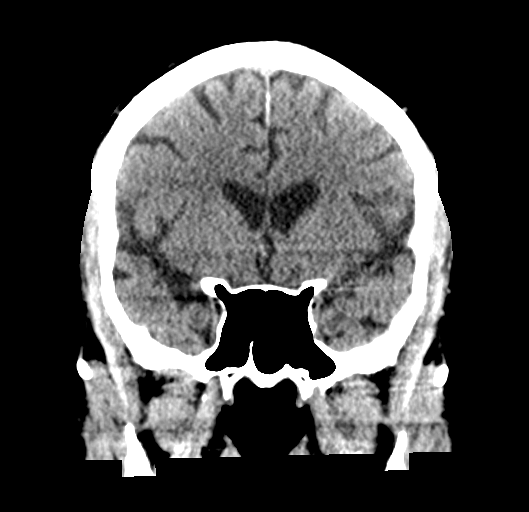

[Series 5: sagittal soft tissue · sagittal · 0.35mm/px · 3 of 54 slices shown]
[im 18/54  brain]
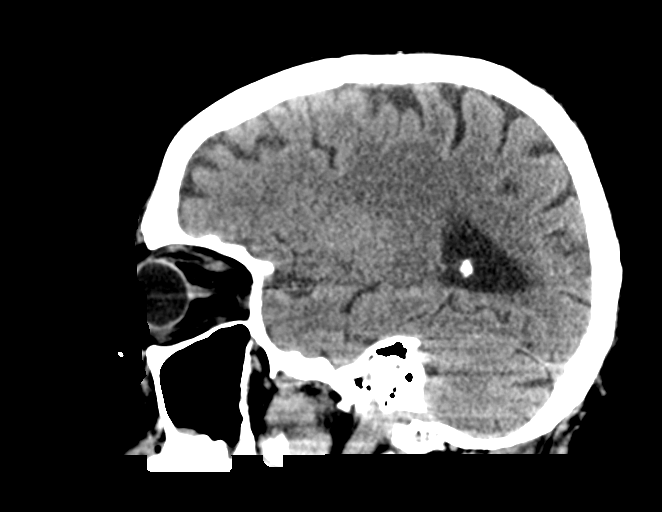
[im 27/54  brain]
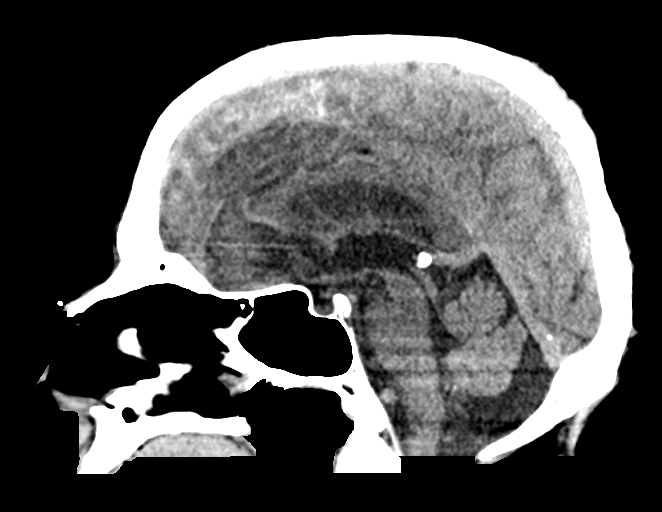
[im 36/54  brain]
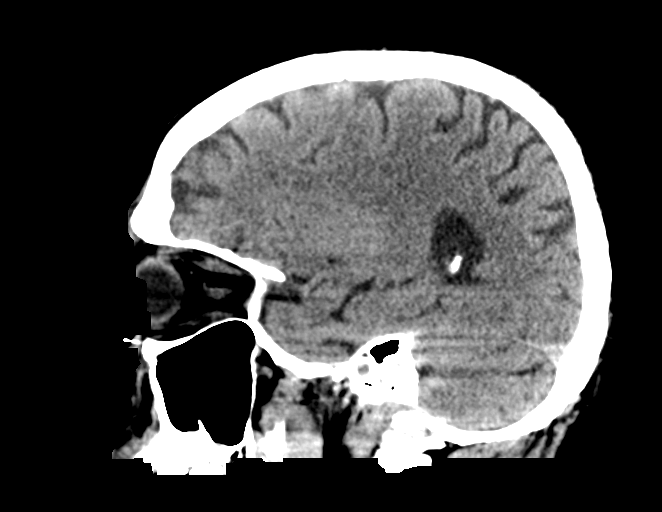

[17 of 47 positions shown; findings below may reference images not displayed]

FINDINGS: Brain: No acute infarct, hemorrhage, mass, mass effect, or midline
shift. No extra-axial collection or hydrocephalus.

Vascular: No hyperdense vessel. Atherosclerotic calcifications in
the intracranial carotid and vertebral arteries.

Skull: Normal. Negative for fracture or focal lesion.

Sinuses/Orbits: Postsurgical changes in the bilateral globes. Mild
mucosal thickening in the maxillary sinuses.

Other: The mastoids are well aerated.
IMPRESSION: No acute intracranial process.

## 2023-09-23 IMAGING — US US CAROTID DUPLEX BILAT
1 series · 13 of 24 positions shown · non-contrast
Comparison: None.

CLINICAL DATA: Syncopal episode. History of diabetes and
hyperlipidemia.

EXAM:
BILATERAL CAROTID DUPLEX ULTRASOUND
TECHNIQUE: Gray scale imaging, color Doppler and duplex ultrasound were
performed of bilateral carotid and vertebral arteries in the neck.

[Series 1: us carotid bilateral · 13 of 62 slices shown]
[im 1/62]
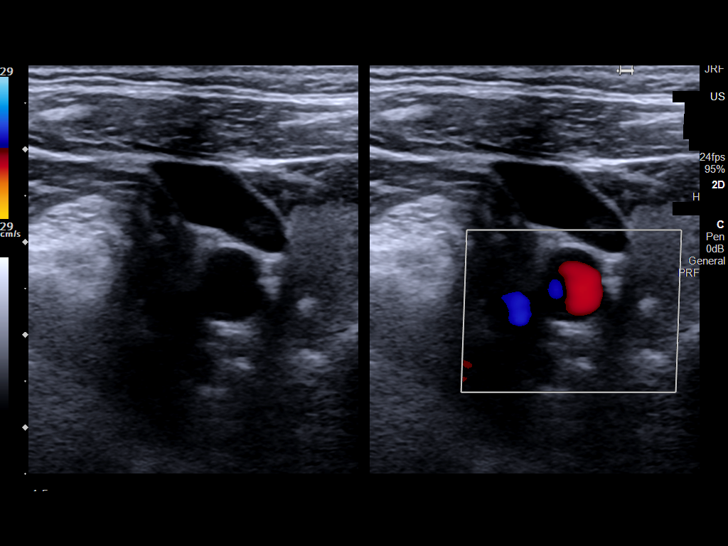
[im 6/62]
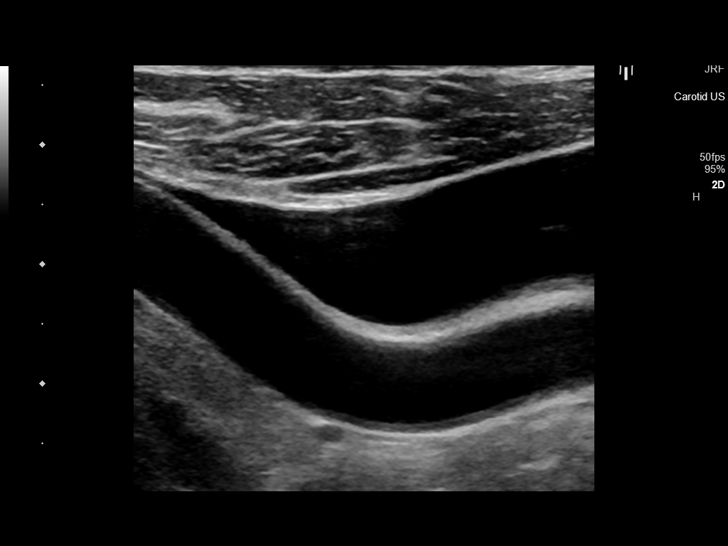
[im 11/62]
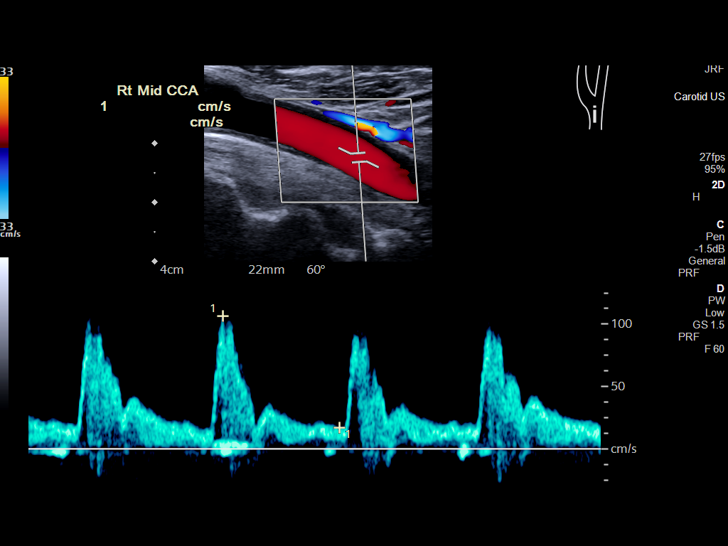
[im 16/62]
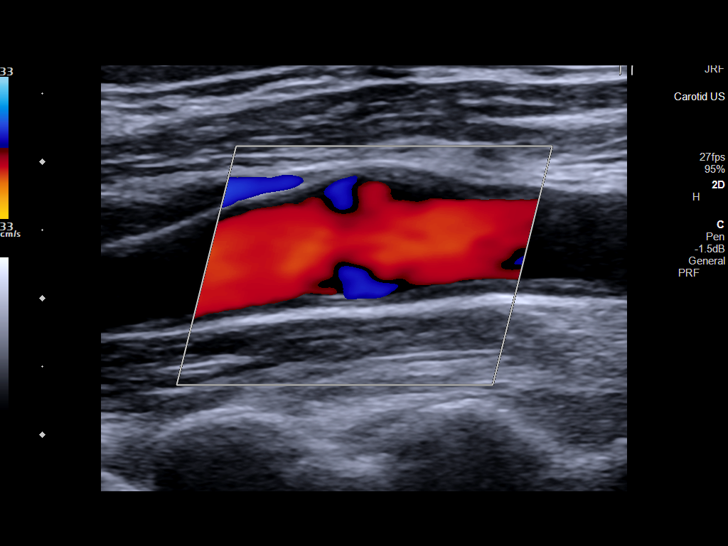
[im 22/62]
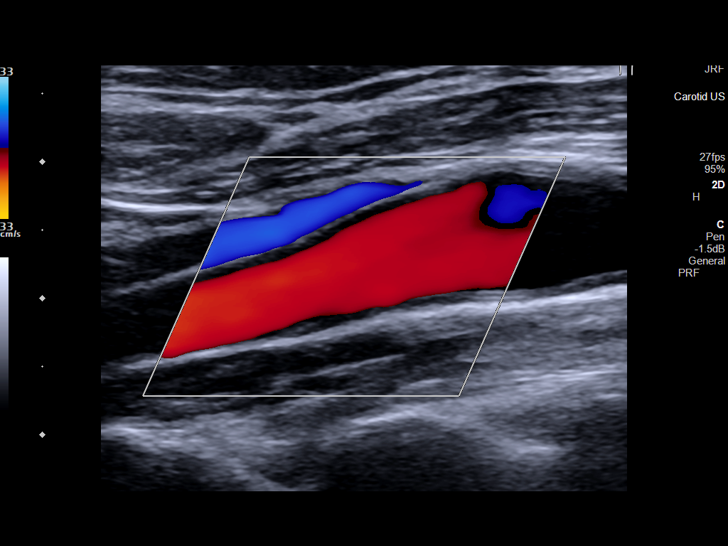
[im 27/62]
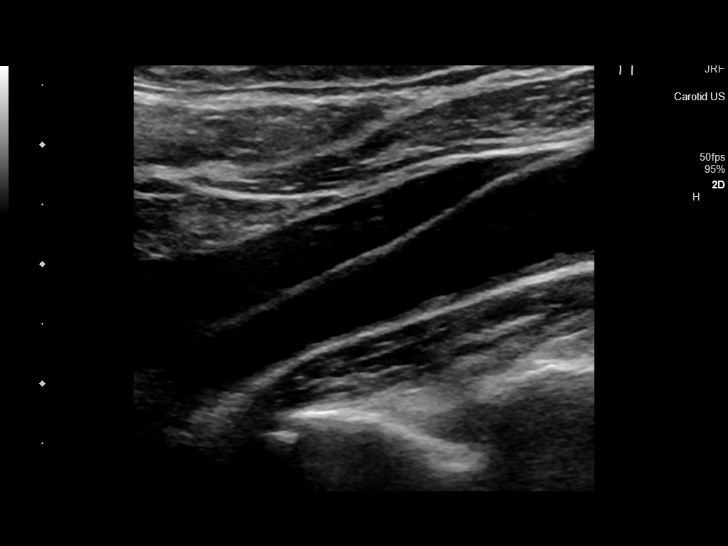
[im 32/62]
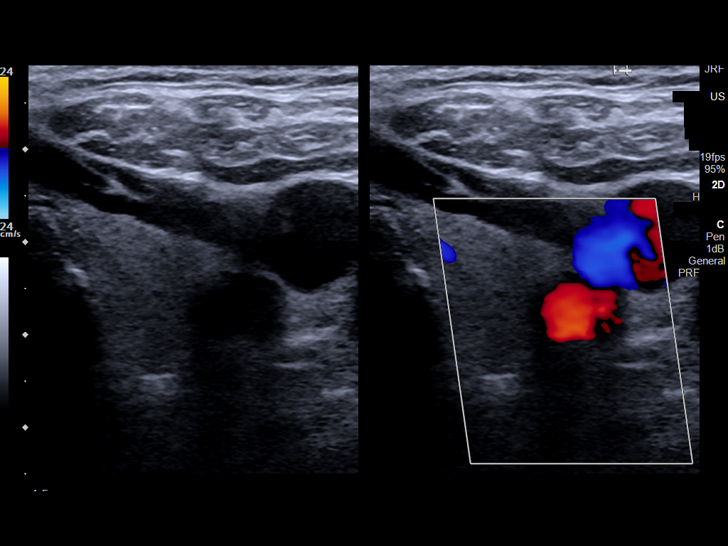
[im 35/62]
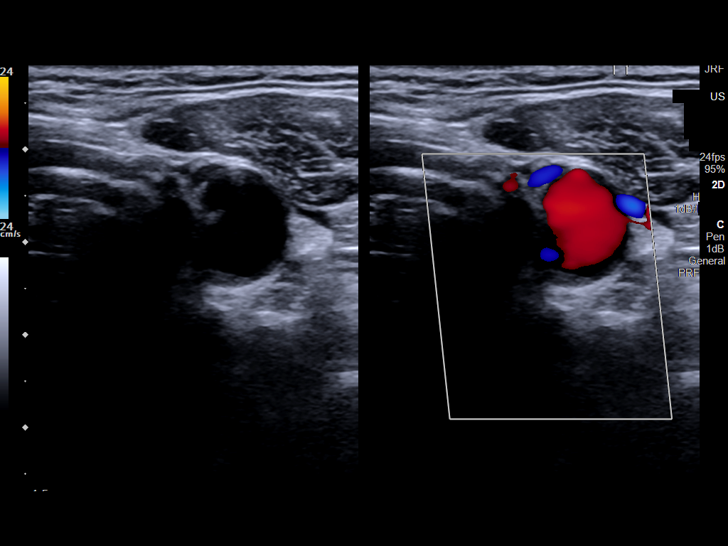
[im 40/62]
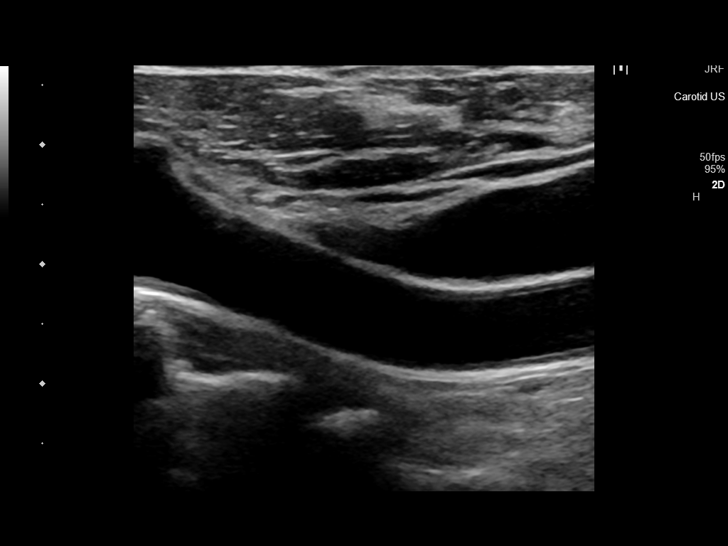
[im 46/62]
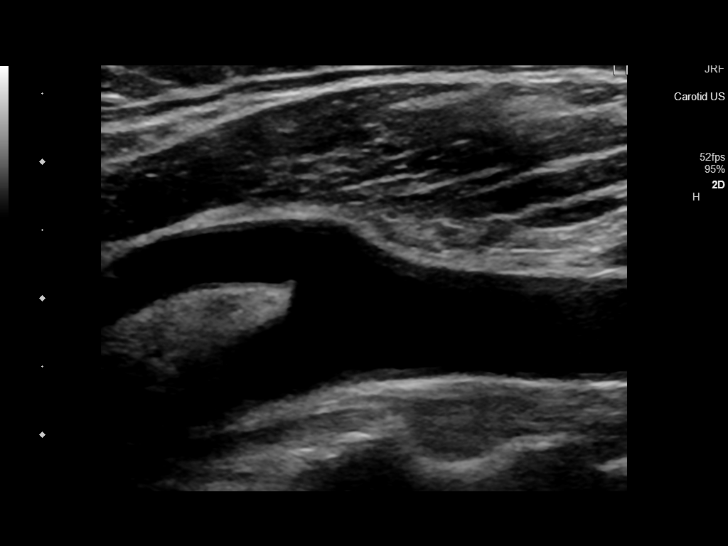
[im 51/62]
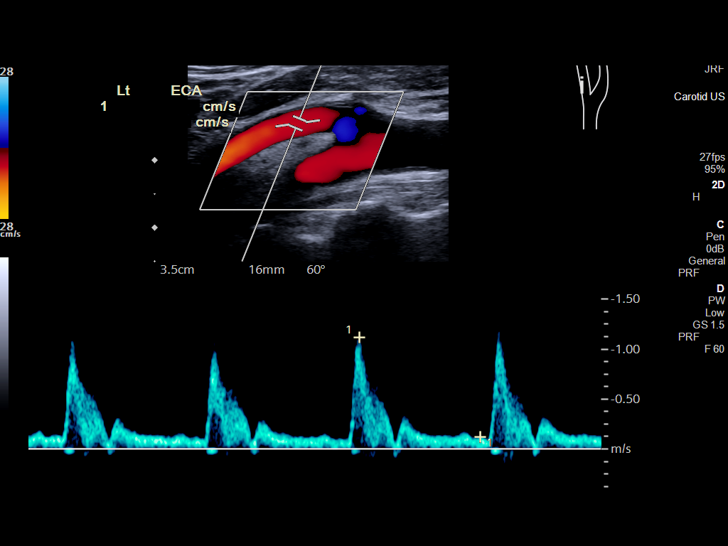
[im 56/62]
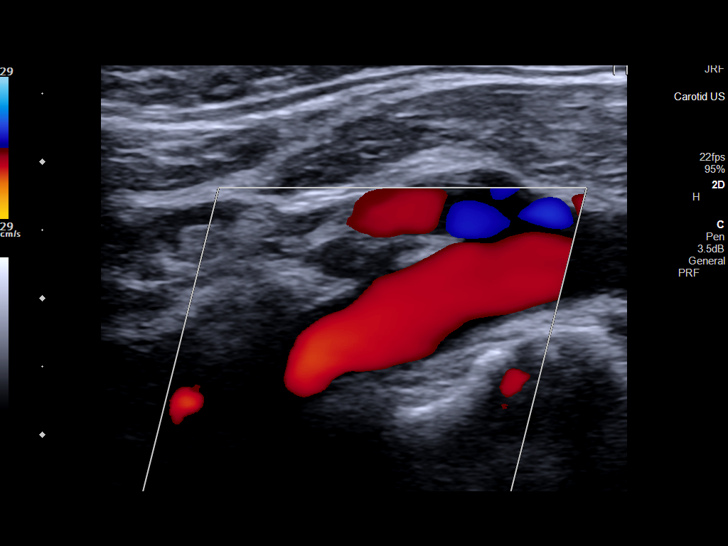
[im 62/62]
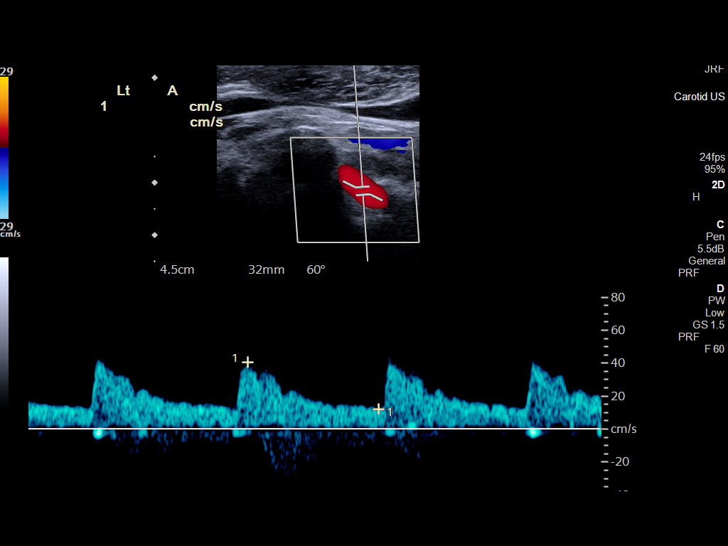

[13 of 24 positions shown; findings below may reference images not displayed]

FINDINGS: Criteria: Quantification of carotid stenosis is based on velocity
parameters that correlate the residual internal carotid diameter
with NASCET-based stenosis levels, using the diameter of the distal
internal carotid lumen as the denominator for stenosis measurement.

The following velocity measurements were obtained:

RIGHT

ICA: 106/37 cm/sec

CCA: 107/17 cm/sec

SYSTOLIC ICA/CCA RATIO:

ECA: 137 cm/sec

LEFT

ICA: 103/16 cm/sec

CCA: 81/16 cm/sec

SYSTOLIC ICA/CCA RATIO:

ECA: 112 cm/sec

RIGHT CAROTID ARTERY: There is a minimal amount of intimal
thickening within the right carotid bulb (image 16), not resulting
in elevated peak systolic velocities within the interrogated course
of the right internal carotid artery to suggest a hemodynamically
significant stenosis.

RIGHT VERTEBRAL ARTERY:  Antegrade flow

LEFT CAROTID ARTERY: There is a minimal amount of intimal thickening
involving the left carotid bulb, extending to involve the origin and
proximal aspects of the left internal carotid artery (image 53), not
resulting in elevated peak systolic velocities within the
interrogated course the left internal carotid artery to suggest a
hemodynamically significant stenosis.

LEFT VERTEBRAL ARTERY:  Antegrade flow
IMPRESSION: Minimal amount of bilateral intimal thickening, not resulting in a
hemodynamically significant stenosis within either internal carotid
artery.

## 2023-10-18 ENCOUNTER — Ambulatory Visit: Attending: Cardiology | Admitting: Cardiology

## 2023-11-23 ENCOUNTER — Encounter: Payer: Self-pay | Admitting: Student

## 2023-11-23 ENCOUNTER — Ambulatory Visit: Attending: Student | Admitting: Student

## 2023-11-23 ENCOUNTER — Other Ambulatory Visit: Payer: Self-pay

## 2023-11-23 VITALS — BP 134/80 | HR 94 | Ht 66.0 in | Wt 209.0 lb

## 2023-11-23 DIAGNOSIS — I10 Essential (primary) hypertension: Secondary | ICD-10-CM

## 2023-11-23 DIAGNOSIS — E7800 Pure hypercholesterolemia, unspecified: Secondary | ICD-10-CM

## 2023-11-23 DIAGNOSIS — Z780 Asymptomatic menopausal state: Secondary | ICD-10-CM

## 2023-11-23 NOTE — Progress Notes (Signed)
   Cardiology Clinic Note   Date: 11/23/2023 ID: [place], Hilbert [birth year], MRN [id number]  Primary Care Provider: Britta King, MD   Primary Cardiologist:  Deatrice Cage, MD  Chief Complaint   Albert Gray is a 78 y.o. male who presents to the clinic today for new patient visit.   Patient Profile      Past medical history significant for: Hypertension. Hyperlipidemia.  Lipid panel 10/05/2023: LDL 121, HDL 43, TG 102, total 183. T2DM.  CKD stage II      History of Present Illness    Today, patient is accompanied by his wife. He reports he is here today because his wife just recently had stents placed and he felt he needed to have a checkup. Patient denies shortness of breath, dyspnea on exertion, lower extremity edema, orthopnea or PND. No chest pain, pressure, or tightness. No palpitations. He walks with a quad cane or walker for stability secondary to numbness in bilateral feet and pain in bilateral knees. 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. He has never smoked or drank alcohol. His BP typically runs in the 140s. He prefers to manage cholesterol and BP with diet and physical activity.   His father passed away at age 38 with an MI. No other history of CAD that he is aware of.     ROS: All other systems reviewed and are otherwise negative except as noted in History of Present Illness.  EKGs/Labs Reviewed    EKG Interpretation Date/Time:  Thursday November 23 2023 15:28:04 EDT Ventricular Rate:  94 PR Interval:  162 QRS Duration:  82 QT Interval:  344 QTC Calculation: 430 R Axis:   27  Text Interpretation: Sinus rhythm with occasional Premature ventricular complexes Nonspecific T wave abnormality No previous ECGs available Confirmed by Loistine Sober [phone]) on 11/23/2023 3:32:45 PM     Physical Exam    VS:  BP 134/80 (BP Location: Left Arm, Patient Position:  Sitting, Cuff Size: Large)   Pulse 94   Ht 5' 6 (1.676 m)   Wt 209 lb (94.8 kg)   SpO2 97%   BMI 33.73 kg/m  , BMI Body mass index is 33.73 kg/m.  GEN: Well nourished, well developed, in no acute distress. Neck: No JVD or carotid bruits. Cardiac:  RRR.  No murmur. No rubs or gallops.   Respiratory:  Respirations regular and unlabored. Clear to auscultation without rales, wheezing or rhonchi. GI: Soft, nontender, nondistended. Extremities: Radials/DP/PT 2+ and equal bilaterally. No clubbing or cyanosis. No edema   Skin: Warm and dry, no rash. Neuro: Strength intact.  Assessment & Plan   Hypertension BP today  134/80. No report of headaches or dizziness.  - Work on decreasing portions and increasing physical activity including seated exercises.   Hyperlipidemia LDL 121 September 2025. Patient prefer to manage cholesterol with diet and exercise. He does not eat a lot of meat.  - Work on decreasing portions and increasing physical activity.   Disposition: Return in 6 months with Dr. Arida or sooner as needed.          Signed, Sober HERO. Doniven Vanpatten, DNP, NP-C

## 2023-11-23 NOTE — Patient Instructions (Signed)
 Medication Instructions:  Your physician recommends that you continue on your current medications as directed. Please refer to the Current Medication list given to you today.  *If you need a refill on your cardiac medications before your next appointment, please call your pharmacy*  Lab Work: No labs ordered today  If you have labs (blood work) drawn today and your tests are completely normal, you will receive your results only by: MyChart Message (if you have MyChart) OR A paper copy in the mail If you have any lab test that is abnormal or we need to change your treatment, we will call you to review the results.  Testing/Procedures: No test ordered today   Follow-Up: At [facility], you and your health needs are our priority.  As part of our continuing mission to provide you with exceptional heart care, our providers are all part of one team.  This team includes your primary Cardiologist (physician) and Advanced Practice Providers or APPs (Physician Assistants and Nurse Practitioners) who all work together to provide you with the care you need, when you need it.  Your next appointment:   6 month(s)  Provider:  Dr. Deatrice Cage only please   We recommend signing up for the patient portal called MyChart.  Sign up information is provided on this After Visit Summary.  MyChart is used to connect with patients for Virtual Visits (Telemedicine).  Patients are able to view lab/test results, encounter notes, upcoming appointments, etc.  Non-urgent messages can be sent to your provider as well.   To learn more about what you can do with MyChart, go to [email or web address].

## 2024-02-14 ENCOUNTER — Encounter: Payer: Self-pay | Admitting: Cardiovascular Disease

## 2024-02-14 ENCOUNTER — Ambulatory Visit: Attending: Cardiovascular Disease | Admitting: Cardiovascular Disease

## 2024-02-14 VITALS — BP 120/70 | HR 91 | Ht 66.0 in | Wt 208.4 lb

## 2024-02-14 DIAGNOSIS — I10 Essential (primary) hypertension: Secondary | ICD-10-CM

## 2024-02-14 DIAGNOSIS — R072 Precordial pain: Secondary | ICD-10-CM

## 2024-02-14 DIAGNOSIS — E7800 Pure hypercholesterolemia, unspecified: Secondary | ICD-10-CM

## 2024-02-14 DIAGNOSIS — I2089 Other forms of angina pectoris: Secondary | ICD-10-CM | POA: Diagnosis not present

## 2024-02-14 MED ORDER — METOPROLOL TARTRATE 100 MG PO TABS: ORAL_TABLET | ORAL | 0 refills | Status: AC

## 2024-02-14 NOTE — Patient Instructions (Addendum)
 Medication Instructions:  Be sure to bring your medications to your follow up appointment *If you need a refill on your cardiac medications before your next appointment, please call your pharmacy*  Lab Work: Your provider would like for you to have the following labs today: BMET  If you have labs (blood work) drawn today and your tests are completely normal, you will receive your results only by: MyChart Message (if you have MyChart) OR A paper copy in the mail If you have any lab test that is abnormal or we need to change your treatment, we will call you to review the results.  Follow-Up: At [facility], you and your health needs are our priority.  As part of our continuing mission to provide you with exceptional heart care, our providers are all part of one team.  This team includes your primary Cardiologist (physician) and Advanced Practice Providers or APPs (Physician Assistants and Nurse Practitioners) who all work together to provide you with the care you need, when you need it.  Your next appointment:   1 month(s)  Provider:   You may see Deatrice Cage, MD or one of the following Advanced Practice Providers on your designated Care Team:   Lonni Meager, NP Lesley Maffucci, PA-C Bernardino Bring, PA-C Cadence [place], PA-C Tylene Lunch, NP Barnie Hila, NP    We recommend signing up for the patient portal called MyChart.  Sign up information is provided on this After Visit Summary.  MyChart is used to connect with patients for Virtual Visits (Telemedicine).  Patients are able to view lab/test results, encounter notes, upcoming appointments, etc.  Non-urgent messages can be sent to your provider as well.   To learn more about what you can do with MyChart, go to [email or web address].   Other Instructions   Your cardiac CT will be scheduled at one of the below locations:   [facility] [address] [place], [place] [address] [phone] (Severe  contrast allergies only)  OR   [facility] [address] [place], [place] [address] [phone]  OR   [facility] [address] [place], [place] [address] [phone]  OR   [facility] and Vascular Tower [address]  [place], [place] [address]  OR   [facility] [address] [place], [place] [phone]  If scheduled at [facility], please arrive at the [facility] and Children's Entrance (Entrance C2) of [facility] 30 minutes prior to test start time. You can use the FREE valet parking offered at entrance C (encouraged to control the heart rate for the test)  Proceed to the [facility] Radiology Department (first floor) to check-in and test prep.  All radiology patients and guests should use entrance C2 at [facility], accessed from [facility], even though the hospital's physical address listed is [address].  If scheduled at the Heart and Vascular Tower at [address], please enter the parking lot using the [address] entrance and use the FREE valet service at the patient drop-off area. Enter the building and check-in with registration on the main floor.  If scheduled at [facility], please arrive to the Heart and Vascular Center 15 mins early for check-in and test prep.  There is spacious parking and easy access to the radiology department from the [facility] Heart and Vascular entrance. Please enter here and check-in with the desk attendant.   If scheduled at  [facility], please arrive 30 minutes early for check-in and test prep.  Please follow these instructions carefully (unless otherwise directed):  An IV will be required for this test and Nitroglycerin will be given.  Hold all erectile dysfunction medications at least 3 days (72 hrs) prior to test. (Ie viagra, cialis, sildenafil, tadalafil, etc)   On the Night Before the  Test: Be sure to Drink plenty of water. Do not consume any caffeinated/decaffeinated beverages or chocolate 12 hours prior to your test. Do not take any antihistamines 12 hours prior to your test.  On the Day of the Test: Drink plenty of water until 1 hour prior to the test. Do not eat any food 1 hour prior to test. You may take your regular medications prior to the test.  Take metoprolol  (Lopressor ) two hours prior to test. If you take Furosemide/Hydrochlorothiazide /Spironolactone/Chlorthalidone, please HOLD on the morning of the test. Patients who wear a continuous glucose monitor MUST remove the device prior to scanning.       After the Test: Drink plenty of water. After receiving IV contrast, you may experience a mild flushed feeling. This is normal. On occasion, you may experience a mild rash up to 24 hours after the test. This is not dangerous. If this occurs, you can take Benadryl 25 mg, Zyrtec, Claritin, or Allegra and increase your fluid intake. (Patients taking Tikosyn should avoid Benadryl, and may take Zyrtec, Claritin, or Allegra) If you experience trouble breathing, this can be serious. If it is severe call 911 IMMEDIATELY. If it is mild, please call our office.  We will call to schedule your test 2-4 weeks out understanding that some insurance companies will need an authorization prior to the service being performed.   For more information and frequently asked questions, please visit our website : [email or web address]  For non-scheduling related questions, please contact the cardiac imaging nurse navigator should you have any questions/concerns: Cardiac Imaging Nurse Navigators Direct Office Dial: [phone]   For scheduling needs, including cancellations and rescheduling, please call Brittany, [phone].

## 2024-02-14 NOTE — Progress Notes (Unsigned)
 "    Cardiology Office Note   Date:  02/14/2024   ID:  Albert Gray, DOB 10-31-1945, MRN [id number]  PCP:  Patient, No Pcp Per  Cardiologist:   Deatrice Cage, MD   Chief Complaint  Patient presents with   Follow-up    6 Month f/u no complaints today. Meds reviewed verbally with pt.      History of Present Illness: Albert Gray is a 79 y.o. male who presents for a follow-up visit regarding essential hypertension and hyperlipidemia. He has known history of uncontrolled diabetes mellitus with most recent hemoglobin A1c of 9.  He has chronic weakness in his legs and uses a cane and occasionally a walker.  He is a lifelong non-smoker.  He is sedentary overall.  He reports dyspnea with activities but no chest pain.  No orthopnea or PND. He takes metformin  for diabetes and recently started taking rosuvastatin  for hyperlipidemia.   Past Medical History:  Diagnosis Date   Arthritis    Diabetes mellitus without complication (HCC)    Hyperlipidemia    Hypertension     Past Surgical History:  Procedure Laterality Date   CATARACT EXTRACTION W/PHACO Left 11/11/2021   Procedure: CATARACT EXTRACTION PHACO AND INTRAOCULAR LENS PLACEMENT (IOC) LEFT DIABETIC 36.00 02:47.1;  Surgeon: Enola Feliciano Hugger, MD;  Location: [facility];  Service: Ophthalmology;  Laterality: Left;  Diabetic   CATARACT EXTRACTION W/PHACO Right 11/25/2021   Procedure: CATARACT EXTRACTION PHACO AND INTRAOCULAR LENS PLACEMENT (IOC) RIGHT DIABETIC 24.14  02:11.8;  Surgeon: Enola Feliciano Hugger, MD;  Location: [facility];  Service: Ophthalmology;  Laterality: Right;  Diabetic   COLONOSCOPY  2002   Dr Dellie x 2   CYSTOSCOPY WITH FULGERATION N/A 05/13/2019   Procedure: CYSTOSCOPY WITH FULGERATION;  Surgeon: Penne Knee, MD;  Location: [facility];  Service: Urology;  Laterality: N/A;   CYSTOSCOPY WITH LITHOLAPAXY N/A 05/13/2019   Procedure: CYSTOSCOPY WITH LITHOLAPAXY;  Surgeon: Penne Knee, MD;   Location: [facility];  Service: Urology;  Laterality: N/A;   HERNIA REPAIR  2000     Current Outpatient Medications  Medication Sig Dispense Refill   ASPIRIN 81 PO Take by mouth.     blood glucose meter kit and supplies KIT 1 each by Other route 2 (two) times daily. Dispense based on patient and insurance preference. Test blood sugar BID. 1 each 3   CALCIUM -MAGNESIUM-VITAMIN D PO Take by mouth daily at 2 am.     Cyanocobalamin  (VITAMIN B-12 PO) Take by mouth once a week.     glucose blood test strip 1 each by Other route 4 (four) times daily. Check blood sugar twice a day, morning before meal and evening 600 strip 3   metFORMIN  (GLUCOPHAGE ) 500 MG tablet TAKE 1 TABLET BY MOUTH TWICE DAILY WITH A MEAL 120 tablet 0   rosuvastatin  (CRESTOR ) 20 MG tablet Take 1 tablet (20 mg total) by mouth as needed. 30 tablet 4   tamsulosin (FLOMAX) 0.4 MG CAPS capsule Take 0.4 mg by mouth daily after breakfast.      acetaminophen  (TYLENOL ) 500 MG tablet Take 500 mg by mouth daily. (Patient not taking: Reported on 02/14/2024)     dapagliflozin propanediol (FARXIGA) 10 MG TABS tablet Take 10 mg by mouth daily. (Patient not taking: Reported on 02/14/2024)     finasteride (PROSCAR) 5 MG tablet Take 5 mg by mouth every morning.  (Patient not taking: Reported on 02/14/2024)     furosemide (LASIX) 20 MG tablet Take 20  mg by mouth daily. (Patient not taking: Reported on 11/23/2023)     lisinopril -hydrochlorothiazide  (ZESTORETIC ) 20-12.5 MG tablet Take 1 tablet by mouth once daily (Patient not taking: Reported on 02/14/2024) 90 tablet 0   meclizine  (ANTIVERT ) 12.5 MG tablet Take 1 tablet (12.5 mg total) by mouth 2 (two) times daily as needed for dizziness. (Patient not taking: Reported on 11/23/2023) 30 tablet 1   polyethylene glycol (MIRALAX  / GLYCOLAX ) 17 g packet Take 17 g by mouth daily as needed (pain.). (Patient not taking: Reported on 02/14/2024)     Semaglutide 3 MG TABS Take 3 mg by mouth daily. (Patient not taking:  Reported on 02/14/2024)     Vitamin D, Ergocalciferol, (DRISDOL) 1.25 MG (50000 UNIT) CAPS capsule Take 50,000 Units by mouth every 7 (seven) days. (Patient not taking: Reported on 02/14/2024)     No current facility-administered medications for this visit.    Allergies:   Patient has no known allergies.    Social History:  The patient  reports that he quit smoking about 56 years ago. His smoking use included cigarettes. He has never used smokeless tobacco. He reports that he does not drink alcohol and does not use drugs.   Family History:  The patient's family history is not on file.    ROS:  Please see the history of present illness.   Otherwise, review of systems are positive for none.   All other systems are reviewed and negative.    PHYSICAL EXAM: VS:  BP 120/70 (BP Location: Left Arm, Patient Position: Sitting, Cuff Size: Large)   Pulse 91   Ht 5' 6 (1.676 m)   Wt 208 lb 6 oz (94.5 kg)   SpO2 90%   BMI 33.63 kg/m  , BMI Body mass index is 33.63 kg/m. GEN: Well nourished, well developed, in no acute distress  HEENT: normal  Neck: no JVD, carotid bruits, or masses Cardiac: RRR; no murmurs, rubs, or gallops,no edema  Respiratory:  clear to auscultation bilaterally, normal work of breathing GI: soft, nontender, nondistended, + BS MS: no deformity or atrophy  Skin: warm and dry, no rash Neuro:  Strength and sensation are intact Psych: euthymic mood, full affect   EKG:  EKG is ordered today. The ekg ordered today demonstrates : Normal sinus rhythm Nonspecific T wave abnormality When compared with ECG of 23-Nov-2023 15:28, Premature ventricular complexes are no longer Present    Recent Labs: No results found for requested labs within last 365 days.    Lipid Panel    Component Value Date/Time   CHOL 177 11/17/2020 1116   TRIG 135 11/17/2020 1116   HDL 37 (L) 11/17/2020 1116   CHOLHDL 4.8 11/17/2020 1116   LDLCALC 115 (H) 11/17/2020 1116      Wt Readings from  Last 3 Encounters:  02/14/24 208 lb 6 oz (94.5 kg)  11/23/23 209 lb (94.8 kg)  11/25/21 210 lb (95.3 kg)          11/23/2023    3:16 PM  PAD Screen  Previous PAD dx? No  Previous surgical procedure? No  Pain with walking? No  Feet/toe relief with dangling? No  Painful, non-healing ulcers? No  Extremities discolored? No      ASSESSMENT AND PLAN:  1.  Exertional dyspnea likely anginal equivalent in a patient with prolonged history of uncontrolled diabetes mellitus.  He is not able to exercise on a treadmill due to arthritis and chronic leg weakness.  I recommend ischemic cardiac evaluation with cardiac  CTA.  2.  Essential hypertension: His blood pressure is mostly controlled without medications but should consider an ARB given that he is diabetic.  3.  Hyperlipidemia: I agree that he requires treatment with a statin given that his LDL is above 100 and he is diabetic.  He is now taking rosuvastatin .  4.  Diabetes mellitus: Suboptimally controlled with most recent hemoglobin A1c of 9.  He is on metformin .  This is managed by his primary care physician.    Disposition:   FU after cardiac CTA.  Signed,  Deatrice Cage, MD  02/14/2024 9:35 AM    [facility] "

## 2024-02-15 ENCOUNTER — Ambulatory Visit: Payer: Self-pay | Admitting: *Deleted

## 2024-02-15 LAB — BASIC METABOLIC PANEL WITH GFR
BUN/Creatinine Ratio: 15 (ref 10–24)
BUN: 19 mg/dL (ref 8–27)
CO2: 23 mmol/L (ref 20–29)
Calcium: 9.6 mg/dL (ref 8.6–10.2)
Chloride: 100 mmol/L (ref 96–106)
Creatinine, Ser: 1.26 mg/dL (ref 0.76–1.27)
Glucose: 266 mg/dL — ABNORMAL HIGH (ref 70–99)
Potassium: 4.6 mmol/L (ref 3.5–5.2)
Sodium: 139 mmol/L (ref 134–144)
eGFR: 59 mL/min/1.73 — ABNORMAL LOW

## 2024-03-13 ENCOUNTER — Ambulatory Visit: Admitting: Cardiovascular Disease
